# Patient Record
Sex: Female | Born: 1970 | Race: Asian | Hispanic: No | Marital: Married | Smoking: Never smoker
Health system: Southern US, Community
[De-identification: ages and names within clinical notes are randomized; demographics above are authoritative.]

## PROBLEM LIST (undated history)

## (undated) DIAGNOSIS — G629 Polyneuropathy, unspecified: Secondary | ICD-10-CM

## (undated) HISTORY — DX: Polyneuropathy, unspecified: G62.9

## (undated) HISTORY — PX: PARTIAL HYSTERECTOMY: SHX80

## (undated) HISTORY — PX: TONSILLECTOMY: SUR1361

---

## 2008-12-05 HISTORY — PX: LAPAROSCOPIC SUPRACERVICAL HYSTERECTOMY: SUR797

## 2016-01-28 LAB — BASIC METABOLIC PANEL
BUN: 13 mg/dL (ref 4–21)
CREATININE: 0.8 mg/dL (ref <=1.1)
Glucose: 100 mg/dL
Potassium: 4.8 mmol/L (ref 3.4–5.3)
Sodium: 140 mmol/L (ref 137–147)

## 2016-01-28 LAB — LIPID PANEL
Cholesterol: 187 mg/dL (ref 0–200)
HDL: 95 mg/dL — AB (ref 35–70)
LDL Cholesterol: 82 mg/dL
TRIGLYCERIDES: 51 mg/dL (ref 40–160)

## 2016-01-28 LAB — TSH: TSH: 1.44 u[IU]/mL (ref <=5.90)

## 2016-01-28 LAB — CBC AND DIFFERENTIAL: Hemoglobin: 12.7 g/dL (ref 12.0–16.0)

## 2016-04-25 LAB — HM PAP SMEAR: HM PAP: NORMAL

## 2016-04-25 LAB — HM MAMMOGRAPHY: HM Mammogram: NORMAL (ref 0–4)

## 2016-04-25 LAB — HM DIABETES EYE EXAM

## 2017-02-10 ENCOUNTER — Ambulatory Visit: Payer: BLUE CROSS/BLUE SHIELD | Admitting: Family Medicine

## 2017-04-03 ENCOUNTER — Ambulatory Visit: Payer: BLUE CROSS/BLUE SHIELD | Admitting: Family Medicine

## 2017-04-05 ENCOUNTER — Ambulatory Visit: Payer: BLUE CROSS/BLUE SHIELD | Admitting: Adult Health

## 2017-05-02 ENCOUNTER — Ambulatory Visit (INDEPENDENT_AMBULATORY_CARE_PROVIDER_SITE_OTHER): Payer: BLUE CROSS/BLUE SHIELD | Admitting: Family Medicine

## 2017-05-02 ENCOUNTER — Encounter: Payer: Self-pay | Admitting: Family Medicine

## 2017-05-02 VITALS — BP 122/81 | HR 77 | Temp 98.4°F | Ht 69.0 in | Wt 188.0 lb

## 2017-05-02 DIAGNOSIS — E663 Overweight: Secondary | ICD-10-CM | POA: Diagnosis not present

## 2017-05-02 DIAGNOSIS — F1010 Alcohol abuse, uncomplicated: Secondary | ICD-10-CM | POA: Insufficient documentation

## 2017-05-02 DIAGNOSIS — M722 Plantar fascial fibromatosis: Secondary | ICD-10-CM | POA: Diagnosis not present

## 2017-05-02 DIAGNOSIS — R6889 Other general symptoms and signs: Secondary | ICD-10-CM | POA: Insufficient documentation

## 2017-05-02 DIAGNOSIS — Z8249 Family history of ischemic heart disease and other diseases of the circulatory system: Secondary | ICD-10-CM | POA: Diagnosis not present

## 2017-05-02 DIAGNOSIS — R5383 Other fatigue: Secondary | ICD-10-CM | POA: Diagnosis not present

## 2017-05-02 DIAGNOSIS — J3089 Other allergic rhinitis: Secondary | ICD-10-CM | POA: Insufficient documentation

## 2017-05-02 DIAGNOSIS — Z90711 Acquired absence of uterus with remaining cervical stump: Secondary | ICD-10-CM | POA: Insufficient documentation

## 2017-05-02 DIAGNOSIS — Z818 Family history of other mental and behavioral disorders: Secondary | ICD-10-CM | POA: Insufficient documentation

## 2017-05-02 NOTE — Assessment & Plan Note (Addendum)
Pt admits to having some anxiety sx from time to time but states coping mechanisms are well developed  She understands her inc risk due to fam hx

## 2017-05-02 NOTE — Assessment & Plan Note (Signed)
Obtain labs to r/o more common reasons for fatigue ie- anemia, thyroid abn, b12 def etc.

## 2017-05-02 NOTE — Assessment & Plan Note (Signed)
Discussed icing, stretching and wearing supportive shoes at ALL times with pt.     Activity mod may be nec as well  Printouts from sports med advisor to be given to pt

## 2017-05-02 NOTE — Progress Notes (Signed)
New patient office visit note:  Impression and Recommendations:    1. Exercise intolerance and post-exercise fatigue   2. Environmental and seasonal allergies   3. Overweight (BMI 25.0-29.9)   4. Family history of pulmonary embolism- dad and brother   5. Family history of depression including suicide attempt (brother)   6. Plantar fascia syndrome   7. Excessive drinking of alcohol- more than recommended   8. Fatigue, unspecified type      Exercise intolerance/ post exercise fatigue Obtain labs to r/o more common reasons for fatigue ie- anemia, thyroid abn, b12 def etc.     Overweight (BMI 25.0-29.9) Explained to patient what BMI refers to, and what it means medically.    Told patient to think about it as a "medical risk stratification measurement" and how increasing BMI is associated with increasing risk/ or worsening state of various diseases such as hypertension, hyperlipidemia, diabetes, premature OA, depression etc.  American Heart Association guidelines for healthy diet, basically Mediterranean diet, and exercise guidelines of 30 minutes 5 days per week or more discussed in detail.  I rec pt use lose it app to record all intake   Health counseling performed.  All questions answered.   Family history of pulmonary embolism- dad and brother Ask family about any genetic blood dyscrasias.  Hypercoag w/up had to be done on both.  Pt will talk to dad and brother.    Family history of depression including suicide attempt (brother) Pt admits to having some anxiety sx from time to time but states coping mechanisms are well developed  She understands her inc risk due to fam hx  Environmental and seasonal allergies Well controlled currently   Excessive drinking of alcohol- more than recommended 1 -2 max per day is recommended daily allowance and any more than that can be detrimental to health   Plantar fascia syndrome Discussed icing, stretching and wearing supportive  shoes at ALL times with pt.     Activity mod may be nec as well  Printouts from sports med advisor to be given to pt   The patient was counseled, risk factors were discussed, anticipatory guidance given.   Orders Placed This Encounter  Procedures  . CBC with Differential/Platelet  . Comprehensive metabolic panel  . Hemoglobin A1c  . Lipid panel  . VITAMIN D 25 Hydroxy (Vit-D Deficiency, Fractures)  . Vitamin B12  . TSH  . T4, free  . Magnesium  . Phosphorus     Gross side effects, risk and benefits, and alternatives of medications discussed with patient.  Patient is aware that all medications have potential side effects and we are unable to predict every side effect or drug-drug interaction that may occur.  Expresses verbal understanding and consents to current therapy plan and treatment regimen.  Return in about 4 weeks (around 05/30/2017) for FBW and then OV with me to discuss it. .  Please see AVS handed out to patient at the end of our visit for further patient instructions/ counseling done pertaining to today's office visit.    Note: This document was prepared using Dragon voice recognition software and may include unintentional dictation errors.  ----------------------------------------------------------------------------------------------------------------------    Subjective:    Chief complaint:   Chief Complaint  Patient presents with  . Establish Care     HPI: Julie Garcia is a pleasant 46 y.o. female who presents to [facility] today to review their medical history with me and establish care.  I asked the patient to review their chronic problem list with me to ensure everything was updated and accurate.    All recent office visits with other providers, any medical records that patient brought in etc  - I reviewed today.     Also asked pt to get me medical records from [facility] providers/ specialists that they had seen within the  past 3-5 years- if they are in private practice and/or do not work for a [employer], [facility], [facility], [facility] or [employer] owned practice.  Told them to call their specialists to clarify this if they are not sure.    -->  Patient very concerned today with feeling easily exhausted with exercise that has been going on for at least 2 months now.  She been trying to go work out 3 days per week and each time it makes her feel worse.   She has no chest pain, wheezing or difficulty breathing; no muscle pain/ cramping, no dizziness etc. and she used to be a Marketing executive several years ago.  She is concerned that something is wrong.    --->  Also feels it has been difficult to lose weight.  She has been eating a prudent diet and trying to exercise for the past couple of months and she is the biggest she has ever been.   Patient is very frustrated by this.  She has been heavy-at her heaviest-the past 2 years.  -->  Also has bilateral foot pain.  This is mostly in the arch and back of the arch medially.  It hurts her the most when she wakes up first thing in the morning and takes her first steps.  Also has pain after any prolonged sitting or resting and then takes her first steps.  Pain does get better as she moves more.  Pains been going on for a couple months ever since she started going to the gym 3 days per week and trying to do cardio.  She is not wearing supportive shoes at all.  -->  Patient drinks on average 3 IPA beers per night- she likes the homemade and calorically dense beers.  She tends to have 4-5 beers on weekends.    Her husband does the same.   She states today she uses this as a pill to help her deal with the stress in her life.      -->  She denies a history of anxiety or depression but does admit to occasionally feeling anxious about her daughter being taken and being human trafficked;  She gets palpitations in her chest with excess worry at times and this has been chronic.   She has never been  on meds for         Dad- had 2 PE's in past roughly mid to late 50's-->  as well as brother- had PE as well at age 42 or so.   Fam H/o- depression / anxiety.   Brother even admitted himself  At one point- clincally depressed.   No CA at early onset, no CV events erly on  2 yrs ago- had bldwrk in past. Fasting.   PCP prior- [facility]    - McMann[facility] PA-C.   [facility]--- is the most she has ever been lately-- for the past 2 yrs.  Was at 160 for yrs.    Wt Readings from Last 3 Encounters:  05/02/17 188 lb (85.3 kg)   BP Readings from Last 3 Encounters:  05/02/17 122/81  Pulse Readings from Last 3 Encounters:  05/02/17 77   BMI Readings from Last 3 Encounters:  05/02/17 27.76 kg/m    Patient Care Team    Relationship Specialty Notifications Start End  Thomasene Lotpalski, Meilin Brosh, DO PCP - General Family Medicine  04/25/17     Patient Active Problem List   Diagnosis Date Noted  . Overweight (BMI 25.0-29.9) 05/02/2017    Priority: High  . Exercise intolerance/ post exercise fatigue 05/02/2017    Priority: High  . Family history of pulmonary embolism- dad and brother 05/02/2017    Priority: Medium  . Family history of depression including suicide attempt (brother) 05/02/2017    Priority: Medium  . Environmental and seasonal allergies 05/02/2017    Priority: Low  . Plantar fascia syndrome 05/02/2017    Priority: Low  . Excessive drinking of alcohol- more than recommended 05/02/2017    Priority: Low  . S/P laparoscopic supracervical hysterectomy- 2010 for fibroid tumors 05/02/2017     History reviewed. No pertinent past medical history.   History reviewed. No pertinent past medical history.   Past Surgical History:  Procedure Laterality Date  . CESAREAN SECTION    . PARTIAL HYSTERECTOMY    . TONSILLECTOMY       Family History  Problem Relation Age of Onset  . Pulmonary embolism Father   . Hyperlipidemia Father   . Pulmonary embolism Brother   . Depression Brother   .  Pancreatic cancer Paternal Grandmother   . Endometriosis Mother   . Gallbladder disease Mother      History  Drug Use No     History  Alcohol Use  . Yes     History  Smoking Status  . Never Smoker  Smokeless Tobacco  . Never Used     Outpatient Encounter Prescriptions as of 05/02/2017  Medication Sig  . Aspirin (ASPIR-81 PO) Take by mouth daily.  . cetirizine (ZYRTEC) 10 MG tablet Take 10 mg by mouth as needed for allergies.   No facility-administered encounter medications on file as of 05/02/2017.     Allergies: Patient has no known allergies.   Review of Systems  Constitutional: Negative for chills, diaphoresis, fever, malaise/fatigue and weight loss.  HENT: Negative for congestion, sore throat and tinnitus.   Eyes: Negative for blurred vision, double vision and photophobia.  Respiratory: Negative for cough and wheezing.   Cardiovascular: Positive for palpitations. Negative for chest pain.       Chronic no concern for pt   Gastrointestinal: Negative for blood in stool, diarrhea, nausea and vomiting.  Genitourinary: Negative for dysuria, frequency and urgency.  Musculoskeletal: Negative for joint pain and myalgias.  Skin: Negative for itching and rash.  Neurological: Negative for dizziness, focal weakness, weakness and headaches.  Endo/Heme/Allergies: Negative for environmental allergies and polydipsia. Does not bruise/bleed easily.  Psychiatric/Behavioral: Negative for depression and memory loss. The patient is not nervous/anxious and does not have insomnia.      Objective:   Blood pressure 122/81, pulse 77, temperature 98.4 F (36.9 C), temperature source Oral, height 5\' 9"  (1.753 m), weight 188 lb (85.3 kg), SpO2 100 %. Body mass index is 27.76 kg/m. General: Well Developed, well nourished, and in no acute distress.  Neuro: Alert and oriented x3, extra-ocular muscles intact, sensation grossly intact.  HEENT:[place]/AT, PERRLA, neck supple, No carotid  bruits Skin: no gross rashes  Cardiac: Regular rate and rhythm Respiratory: Essentially clear to auscultation bilaterally. Not using accessory muscles, speaking in full sentences.  Abdominal: not grossly distended  Musculoskeletal: Ambulates w/o diff, FROM * 4 ext.  Vasc: less 2 sec cap RF, warm and pink  Psych:  No HI/SI, judgement and insight good, Euthymic mood. Full Affect.    No results found for this or any previous visit (from the past 2160 hour(s)).

## 2017-05-02 NOTE — Assessment & Plan Note (Signed)
Well-controlled currently 

## 2017-05-02 NOTE — Assessment & Plan Note (Addendum)
Explained to patient what BMI refers to, and what it means medically.    Told patient to think about it as a "medical risk stratification measurement" and how increasing BMI is associated with increasing risk/ or worsening state of various diseases such as hypertension, hyperlipidemia, diabetes, premature OA, depression etc.  American Heart Association guidelines for healthy diet, basically Mediterranean diet, and exercise guidelines of 30 minutes 5 days per week or more discussed in detail.  I rec pt use lose it app to record all intake   Health counseling performed.  All questions answered.

## 2017-05-02 NOTE — Assessment & Plan Note (Signed)
1 -2 max per day is recommended daily allowance and any more than that can be detrimental to health

## 2017-05-02 NOTE — Patient Instructions (Signed)
Exercise guidelines is 150 minutes of moderate intensity exercise weekly that equates to 30 minutes 5 days per week.   Consider obtaining a life coach  Follow-up in the near future for blood work-fasting and then follow-up office visit with me to discuss results   Guidelines for Losing Weight   We want weight loss that will last so you should lose 1-2 pounds a week.  THAT IS IT! Please pick THREE things a month to change. Once it is a habit check off the item. Then pick another three items off the list to become habits.  If you are already doing a habit on the list GREAT!  Cross that item off!  Don't drink your calories. Ie, alcohol, soda, fruit juice, and sweet tea.   Drink more water. Drink a glass when you feel hungry or before each meal.   Eat breakfast - Complex carb and protein (likeDannon light and fit yogurt, oatmeal, fruit, eggs, [place] bacon).  Measure your cereal.  Eat no more than one cup a day. (ie Kashi)  Eat an apple a day.  Add a vegetable a day.  Try a new vegetable a month.  Use Pam! Stop using oil or butter to cook.  Don't finish your plate or use smaller plates.  Share your dessert.  Eat sugar free Jello for dessert or frozen grapes.  Don't eat 2-3 hours before bed.  Switch to whole wheat bread, pasta, and brown rice.  Make healthier choices when you eat out. No fries!  Pick baked chicken, NOT fried.  Don't forget to SLOW DOWN when you eat. It is not going anywhere.   Take the stairs.  Park far away in the parking lot  Lift soup cans (or weights) for 10 minutes while watching TV.  Walk at work for 10 minutes during break.  Walk outside 1 time a week with your friend, kids, dog, or significant other.  Start a walking group at church.  Walk the mall as much as you can tolerate.   Keep a food diary.  Weigh yourself daily.  Walk for 15 minutes 3 days per week.  Cook at home more often and eat out less. If life happens and you go back  to old habits, it is okay.  Just start over. You can do it!  If you experience chest pain, get short of breath, or tired during the exercise, please stop immediately and inform your doctor.    Before you even begin to attack a weight-loss plan, it pays to remember this: You are not fat. You have fat. Losing weight isn't about blame or shame; it's simply another achievement to accomplish. Dieting is like any other skill-you have to buckle down and work at it. As long as you act in a smart, reasonable way, you'll ultimately get where you want to be. Here are some weight loss pearls for you.   1. It's Not a Diet. It's a Lifestyle Thinking of a diet as something you're on and suffering through only for the short term doesn't work. To shed weight and keep it off, you need to make permanent changes to the way you eat. It's OK to indulge occasionally, of course, but if you cut calories temporarily and then revert to your old way of eating, you'll gain back the weight quicker than you can say yo-yo. Use it to lose it. Research shows that one of the best predictors of long-term weight loss is how many pounds you drop in the first  month. For that reason, nutritionists often suggest being stricter for the first two weeks of your new eating strategy to build momentum. Cut out added sugar and alcohol and avoid unrefined carbs. After that, figure out how you can reincorporate them in a way that's healthy and maintainable.  2. There's a Right Way to Exercise Working out burns calories and fat and boosts your metabolism by building muscle. But those trying to lose weight are notorious for overestimating the number of calories they burn and underestimating the amount they take in. Unfortunately, your system is biologically programmed to hold on to extra pounds and that means when you start exercising, your body senses the deficit and ramps up its hunger signals. If you're not diligent, you'll eat everything you burn and  then some. Use it, to lose it. Cardio gets all the exercise glory, but strength and interval training are the real heroes. They help you build lean muscle, which in turn increases your metabolism and calorie-burning ability 3. Don't Overreact to Mild Hunger Some people have a hard time losing weight because of hunger anxiety. To them, being hungry is bad-something to be avoided at all costs-so they carry snacks with them and eat when they don't need to. Others eat because they're stressed out or bored. While you never want to get to the point of being ravenous (that's when bingeing is likely to happen), a hunger pang, a craving, or the fact that it's 3:00 p.m. should not send you racing for the vending machine or obsessing about the energy bar in your purse. Ideally, you should put off eating until your stomach is growling and it's difficult to concentrate.  Use it to lose it. When you feel the urge to eat, use the HALT method. Ask yourself, Am I really hungry? Or am I angry or anxious, lonely or bored, or tired? If you're still not certain, try the apple test. If you're truly hungry, an apple should seem delicious; if it doesn't, something else is going on. Or you can try drinking water and making yourself busy, if you are still hungry try a healthy snack.  4. Not All Calories Are Created Equal The mechanics of weight loss are pretty simple: Take in fewer calories than you use for energy. But the kind of food you eat makes all the difference. Processed food that's high in saturated fat and refined starch or sugar can cause inflammation that disrupts the hormone signals that tell your brain you're full. The result: You eat a lot more.  Use it to lose it. Clean up your diet. Swap in whole, unprocessed foods, including vegetables, lean protein, and healthy fats that will fill you up and give you the biggest nutritional bang for your calorie buck. In a few weeks, as your brain starts receiving regular hunger and  fullness signals once again, you'll notice that you feel less hungry overall and naturally start cutting back on the amount you eat.  5. Protein, Produce, and Plant-Based Fats Are Your Weight-Loss Trinity Here's why eating the three Ps regularly will help you drop pounds. Protein fills you up. You need it to build lean muscle, which keeps your metabolism humming so that you can torch more fat. People in a weight-loss program who ate double the recommended daily allowance for protein (about 110 grams for a 150-pound woman) lost 70 percent of their weight from fat, while people who ate the RDA lost only about 40 percent, one study found. Produce is packed with filling  fiber. "It's very difficult to consume too many calories if you're eating a lot of vegetables. Example: Three cups of broccoli is a lot of food, yet only 93 calories. (Fruit is another story. It can be easy to overeat and can contain a lot of calories from sugar, so be sure to monitor your intake.) Plant-based fats like olive oil and those in avocados and nuts are healthy and extra satiating.  Use it to lose it. Aim to incorporate each of the three Ps into every meal and snack. People who eat protein throughout the day are able to keep weight off, according to a study in the American Journal of Clinical Nutrition. In addition to meat, poultry and seafood, good sources are beans, lentils, eggs, tofu, and yogurt. As for fat, keep portion sizes in check by measuring out salad dressing, oil, and nut butters (shoot for one to two tablespoons). Finally, eat veggies or a little fruit at every meal. People who did that consumed 308 fewer calories but didn't feel any hungrier than when they didn't eat more produce.  7. How You Eat Is As Important As What You Eat In order for your brain to register that you're full, you need to focus on what you're eating. Sit down whenever you eat, preferably at a table. Turn off the TV or computer, put down your phone,  and look at your food. Smell it. Chew slowly, and don't put another bite on your fork until you swallow. When women ate lunch this attentively, they consumed 30 percent less when snacking later than those who listened to an audiobook at lunchtime, according to a study in the [place] Journal of Nutrition. 8. Weighing Yourself Really Works The scale provides the best evidence about whether your efforts are paying off. Seeing the numbers tick up or down or stagnate is motivation to keep going-or to rethink your approach. A 2015 study at [facility] found that daily weigh-ins helped people lose more weight, keep it off, and maintain that loss, even after two years. Use it to lose it. Step on the scale at the same time every day for the best results. If your weight shoots up several pounds from one weigh-in to the next, don't freak out. Eating a lot of salt the night before or having your period is the likely culprit. The number should return to normal in a day or two. It's a steady climb that you need to do something about. 9. Too Much Stress and Too Little Sleep Are Your Enemies When you're tired and frazzled, your body cranks up the production of cortisol, the stress hormone that can cause carb cravings. Not getting enough sleep also boosts your levels of ghrelin, a hormone associated with hunger, while suppressing leptin, a hormone that signals fullness and satiety. People on a diet who slept only five and a half hours a night for two weeks lost 55 percent less fat and were hungrier than those who slept eight and a half hours, according to a study in the [place] [employer] Journal. Use it to lose it. Prioritize sleep, aiming for seven hours or more a night, which research shows helps lower stress. And make sure you're getting quality zzz's. If a snoring spouse or a fidgety cat wakes you up frequently throughout the night, you may end up getting the equivalent of just four hours of sleep,  according to a study from [facility]. Keep pets out of the bedroom, and use a white-noise app to  drown out snoring. 10. You Will Hit a plateau-And You Can Bust Through It As you slim down, your body releases much less leptin, the fullness hormone.  If you're not strength training, start right now. Building muscle can raise your metabolism to help you overcome a plateau. To keep your body challenged and burning calories, incorporate new moves and more intense intervals into your workouts or add another sweat session to your weekly routine. Alternatively, cut an extra 100 calories or so a day from your diet. Now that you've lost weight, your body simply doesn't need as much fuel.      Since food equals calories, in order to lose weight you must either eat fewer calories, exercise more to burn off calories with activity, or both. Food that is not used to fuel the body is stored as fat. A major component of losing weight is to make smarter food choices. Here's how:  1)   Limit non-nutritious foods, such as: Sugar, honey, syrups and candy Pastries, donuts, pies, cakes and cookies Soft drinks, sweetened juices and alcoholic beverages  2)  Cut down on high-fat foods by: - Choosing poultry, fish or lean red meat - Choosing low-fat cooking methods, such as baking, broiling, steaming, grilling and boiling - Using low-fat or non-fat dairy products - Using vinaigrette, herbs, lemon or fat-free salad dressings - Avoiding fatty meats, such as bacon, sausage, franks, ribs and luncheon meats - Avoiding high-fat snacks like nuts, chips and chocolate - Avoiding fried foods - Using less butter, margarine, oil and mayonnaise - Avoiding high-fat gravies, cream sauces and cream-based soups  3) Eat a variety of foods, including: - Fruit and vegetables that are raw, steamed or baked - Whole grains, breads, cereal, rice and pasta - Dairy products, such as low-fat or non-fat milk or yogurt, low-fat  cottage cheese and low-fat cheese - Protein-rich foods like chicken, [place], fish, lean meat and legumes, or beans  4) Change your eating habits by: - Eat three balanced meals a day to help control your hunger - Watch portion sizes and eat small servings of a variety of foods - Choose low-calorie snacks - Eat only when you are hungry and stop when you are satisfied - Eat slowly and try not to perform other tasks while eating - Find other activities to distract you from food, such as walking, taking up a hobby or being involved in the community - Include regular exercise in your daily routine ( minimum of 20 min of moderate-intensity exercise at least 5 days/week)  - Find a support group, if necessary, for emotional support in your weight loss journey         Easy ways to cut 100 calories   1. Eat your eggs with hot sauce OR salsa instead of cheese.  Eggs are great for breakfast, but many people consider eggs and cheese to be BFFs. Instead of cheese-1 oz. of cheddar has 114 calories-top your eggs with hot sauce, which contains no calories and helps with satiety and metabolism. Salsa is also a great option!!  2. Top your toast, waffles or pancakes with fresh berries instead of jelly or syrup. Half a cup of berries-fresh, frozen or thawed-has about 40 calories, compared with 2 tbsp. of maple syrup or jelly, which both have about 100 calories. The berries will also give you a good punch of fiber, which helps keep you full and satisfied and won't spike blood sugar quickly like the jelly or syrup. 3. Swap the non-fat latte  for black coffee with a splash of half-and-half. Contrary to its name, that non-fat latte has 130 calories and a startling 19g of carbohydrates per 16 oz. serving. Replacing that 'light' drinkable dessert with a black coffee with a splash of half-and-half saves you more than 100 calories per 16 oz. serving. 4. Sprinkle salads with freeze-dried raspberries instead of dried  cranberries. If you want a sweet addition to your nutritious salad, stay away from dried cranberries. They have a whopping 130 calories per  cup and 30g carbohydrates. Instead, sprinkle freeze-dried raspberries guilt-free and save more than 100 calories per  cup serving, adding 3g of belly-filling fiber. 5. Go for mustard in place of mayo on your sandwich. Mustard can add really nice flavor to any sandwich, and there are tons of varieties, from spicy to honey. A serving of mayo is 95 calories, versus 10 calories in a serving of mustard.  Or try an avocado mayo spread: You can find the recipe few click this link: https://www.californiaavocado.com/recipes/recipe-container/[place]ifornia-avocado-mayo 6. Choose a DIY salad dressing instead of the store-bought kind. Mix Dijon or whole grain mustard with low-fat Kefir or red wine vinegar and garlic. 7. Use hummus as a spread instead of a dip. Use hummus as a spread on a high-fiber cracker or tortilla with a sandwich and save on calories without sacrificing taste. 8. Pick just one salad "accessory." Salad isn't automatically a calorie winner. It's easy to over-accessorize with toppings. Instead of topping your salad with nuts, avocado and cranberries (all three will clock in at 313 calories), just pick one. The next day, choose a different accessory, which will also keep your salad interesting. You don't wear all your jewelry every day, right? 9. Ditch the white pasta in favor of spaghetti squash. One cup of cooked spaghetti squash has about 40 calories, compared with traditional spaghetti, which comes with more than 200. Spaghetti squash is also nutrient-dense. It's a good source of fiber and Vitamins A and C, and it can be eaten just like you would eat pasta-with a great tomato sauce and [place] meatballs or with pesto, tofu and spinach, for example. 10. Dress up your chili, soups and stews with non-fat [place] yogurt instead of sour cream. Just a 'dollop' of  sour cream can set you back 115 calories and a whopping 12g of fat-seven of which are of the artery-clogging variety. Added bonus: [place] yogurt is packed with muscle-building protein, calcium and B Vitamins. 11. Mash cauliflower instead of mashed potatoes. One cup of traditional mashed potatoes-in all their creamy goodness-has more than 200 calories, compared to mashed cauliflower, which you can typically eat for less than 100 calories per 1 cup serving. Cauliflower is a great source of the antioxidant indole-3-carbinol (I3C), which may help reduce the risk of some cancers, like breast cancer. 12. Ditch the ice cream sundae in favor of a [place] yogurt parfait. Instead of a cup of ice cream or fro-yo for dessert, try 1 cup of nonfat Greek yogurt topped with fresh berries and a sprinkle of cacao nibs. Both toppings are packed with antioxidants, which can help reduce cellular inflammation and oxidative damage. And the comparison is a no-brainer: One cup of ice cream has about 275 calories; one cup of frozen yogurt has about 230; and a cup of Greek yogurt has just 130, plus twice the protein, so you're less likely to return to the freezer for a second helping. 13. Put olive oil in a spray container instead of using it directly from the bottle.  Each tablespoon of olive oil is 120 calories and 15g of fat. Use a mister instead of pouring it straight into the pan or onto a salad. This allows for portion control and will save you more than 100 calories. 14. When baking, substitute canned pumpkin for butter or oil. Canned pumpkin-not pumpkin pie mix-is loaded with Vitamin A, which is important for skin and eye health, as well as immunity. And the comparisons are pretty crazy:  cup of canned pumpkin has about 40 calories, compared to butter or oil, which has more than 800 calories. Yes, 800 calories. Applesauce and mashed banana can also serve as good substitutions for butter or oil, usually in a 1:1 ratio. 15. Top  casseroles with high-fiber cereal instead of breadcrumbs. Breadcrumbs are typically made with white bread, while breakfast cereals contain 5-9g of fiber per serving. Not only will you save more than 150 calories per  cup serving, the swap will also keep you more full and you'll get a metabolism boost from the added fiber. 16. Snack on pistachios instead of macadamia nuts. Believe it or not, you get the same amount of calories from 35 pistachios (100 calories) as you would from only five macadamia nuts. 17. Chow down on kale chips rather than potato chips. This is my favorite 'don't knock it 'till you try it' swap. Kale chips are so easy to make at home, and you can spice them up with a little grated parmesan or chili powder. Plus, they're a mere fraction of the calories of potato chips, but with the same crunch factor we crave so often. 18. Add seltzer and some fruit slices to your cocktail instead of soda or fruit juice. One cup of soda or fruit juice can pack on as much as 140 calories. Instead, use seltzer and fruit slices. The fruit provides valuable phytochemicals, such as flavonoids and anthocyanins, which help to combat cancer and stave off the aging process.

## 2017-05-02 NOTE — Assessment & Plan Note (Signed)
Ask family about any genetic blood dyscrasias.  Hypercoag w/up had to be done on both.  Pt will talk to dad and brother.

## 2017-05-10 ENCOUNTER — Other Ambulatory Visit (INDEPENDENT_AMBULATORY_CARE_PROVIDER_SITE_OTHER): Payer: BLUE CROSS/BLUE SHIELD

## 2017-05-10 DIAGNOSIS — E663 Overweight: Secondary | ICD-10-CM | POA: Diagnosis not present

## 2017-05-10 DIAGNOSIS — F1010 Alcohol abuse, uncomplicated: Secondary | ICD-10-CM | POA: Diagnosis not present

## 2017-05-10 DIAGNOSIS — R6889 Other general symptoms and signs: Secondary | ICD-10-CM

## 2017-05-10 DIAGNOSIS — R5383 Other fatigue: Secondary | ICD-10-CM

## 2017-05-11 LAB — CBC WITH DIFFERENTIAL/PLATELET
BASOS: 0 %
Basophils Absolute: 0 10*3/uL (ref 0.0–0.2)
EOS (ABSOLUTE): 0.1 10*3/uL (ref 0.0–0.4)
EOS: 2 %
HEMATOCRIT: 40.6 % (ref 34.0–46.6)
HEMOGLOBIN: 13.7 g/dL (ref 11.1–15.9)
IMMATURE GRANS (ABS): 0 10*3/uL (ref 0.0–0.1)
IMMATURE GRANULOCYTES: 0 %
LYMPHS: 33 %
Lymphocytes Absolute: 2.2 10*3/uL (ref 0.7–3.1)
MCH: 32.5 pg (ref 26.6–33.0)
MCHC: 33.7 g/dL (ref 31.5–35.7)
MCV: 96 fL (ref 79–97)
MONOCYTES: 6 %
Monocytes Absolute: 0.4 10*3/uL (ref 0.1–0.9)
NEUTROS ABS: 3.9 10*3/uL (ref 1.4–7.0)
NEUTROS PCT: 59 %
PLATELETS: 243 10*3/uL (ref 150–379)
RBC: 4.21 x10E6/uL (ref 3.77–5.28)
RDW: 14.3 % (ref 12.3–15.4)
WBC: 6.7 10*3/uL (ref 3.4–10.8)

## 2017-05-11 LAB — COMPREHENSIVE METABOLIC PANEL
A/G RATIO: 1.5 (ref 1.2–2.2)
ALBUMIN: 4.4 g/dL (ref 3.5–5.5)
ALT: 28 IU/L (ref 0–32)
AST: 17 IU/L (ref 0–40)
Alkaline Phosphatase: 65 IU/L (ref 39–117)
BUN / CREAT RATIO: 13 (ref 9–23)
BUN: 11 mg/dL (ref 6–24)
Bilirubin Total: 0.3 mg/dL (ref 0.0–1.2)
CALCIUM: 9.2 mg/dL (ref 8.7–10.2)
CO2: 25 mmol/L (ref 18–29)
Chloride: 99 mmol/L (ref 96–106)
Creatinine, Ser: 0.85 mg/dL (ref 0.57–1.00)
GFR, EST AFRICAN AMERICAN: 96 mL/min/{1.73_m2} (ref >59)
GFR, EST NON AFRICAN AMERICAN: 84 mL/min/{1.73_m2} (ref >59)
Globulin, Total: 2.9 g/dL (ref 1.5–4.5)
Glucose: 92 mg/dL (ref 65–99)
POTASSIUM: 4.6 mmol/L (ref 3.5–5.2)
Sodium: 141 mmol/L (ref 134–144)
TOTAL PROTEIN: 7.3 g/dL (ref 6.0–8.5)

## 2017-05-11 LAB — LIPID PANEL
CHOL/HDL RATIO: 2.2 ratio (ref 0.0–4.4)
Cholesterol, Total: 193 mg/dL (ref 100–199)
HDL: 88 mg/dL (ref >39)
LDL Calculated: 95 mg/dL (ref 0–99)
Triglycerides: 52 mg/dL (ref 0–149)
VLDL Cholesterol Cal: 10 mg/dL (ref 5–40)

## 2017-05-11 LAB — PHOSPHORUS: Phosphorus: 3.4 mg/dL (ref 2.5–4.5)

## 2017-05-11 LAB — HEMOGLOBIN A1C
Est. average glucose Bld gHb Est-mCnc: 117 mg/dL
Hgb A1c MFr Bld: 5.7 % — ABNORMAL HIGH (ref 4.8–5.6)

## 2017-05-11 LAB — VITAMIN D 25 HYDROXY (VIT D DEFICIENCY, FRACTURES): VIT D 25 HYDROXY: 32.2 ng/mL (ref 30.0–100.0)

## 2017-05-11 LAB — TSH: TSH: 2.27 u[IU]/mL (ref 0.450–4.500)

## 2017-05-11 LAB — VITAMIN B12: Vitamin B-12: 429 pg/mL (ref 232–1245)

## 2017-05-11 LAB — MAGNESIUM: Magnesium: 2.4 mg/dL — ABNORMAL HIGH (ref 1.6–2.3)

## 2017-05-11 LAB — T4, FREE: FREE T4: 1.11 ng/dL (ref 0.82–1.77)

## 2017-05-18 ENCOUNTER — Ambulatory Visit (INDEPENDENT_AMBULATORY_CARE_PROVIDER_SITE_OTHER): Payer: BLUE CROSS/BLUE SHIELD | Admitting: Family Medicine

## 2017-05-18 ENCOUNTER — Encounter: Payer: Self-pay | Admitting: Family Medicine

## 2017-05-18 DIAGNOSIS — R7302 Impaired glucose tolerance (oral): Secondary | ICD-10-CM

## 2017-05-18 NOTE — Patient Instructions (Addendum)
Take a MVI daily, B- complex and a 2,000IU vit D3 OTC per day as well.      Risk factors for prediabetes and type 2 diabetes  Researchers don't fully understand why some people develop prediabetes and type 2 diabetes and others don't.  It's clear that certain factors increase the risk, however, including:  Weight. The more fatty tissue you have, the more resistant your cells become to insulin.  Inactivity. The less active you are, the greater your risk. Physical activity helps you control your weight, uses up glucose as energy and makes your cells more sensitive to insulin.  Family history. Your risk increases if a parent or sibling has type 2 diabetes.  Race. Although it's unclear why, people of certain races - including blacks, Hispanics, American Indians and Asian-Americans - are at higher risk.  Age. Your risk increases as you get older. This may be because you tend to exercise less, lose muscle mass and gain weight as you age. But type 2 diabetes is also increasing dramatically among children, adolescents and younger adults.  Gestational diabetes. If you developed gestational diabetes when you were pregnant, your risk of developing prediabetes and type 2 diabetes later increases. If you gave birth to a baby weighing more than 9 pounds (4 kilograms), you're also at risk of type 2 diabetes.  Polycystic ovary syndrome. For women, having polycystic ovary syndrome - a common condition characterized by irregular menstrual periods, excess hair growth and obesity - increases the risk of diabetes.  High blood pressure. Having blood pressure over 140/90 millimeters of mercury (mm Hg) is linked to an increased risk of type 2 diabetes.  Abnormal cholesterol and triglyceride levels. If you have low levels of high-density lipoprotein (HDL), or "good," cholesterol, your risk of type 2 diabetes is higher. Triglycerides are another type of fat carried in the blood. People with high levels of triglycerides have  an increased risk of type 2 diabetes. Your doctor can let you know what your cholesterol and triglyceride levels are.  A good guide to good carbs: The glycemic index ---If you have diabetes, or at risk for diabetes, you know all too well that when you eat carbohydrates, your blood sugar goes up. The total amount of carbs you consume at a meal or in a snack mostly determines what your blood sugar will do. But the food itself also plays a role. A serving of white rice has almost the same effect as eating pure table sugar - a quick, high spike in blood sugar. A serving of lentils has a slower, smaller effect.  ---Picking good sources of carbs can help you control your blood sugar and your weight. Even if you don't have diabetes, eating healthier carbohydrate-rich foods can help ward off a host of chronic conditions, from heart disease to various cancers to, well, diabetes.  ---One way to choose foods is with the glycemic index (GI). This tool measures how much a food boosts blood sugar.  The glycemic index rates the effect of a specific amount of a food on blood sugar compared with the same amount of pure glucose. A food with a glycemic index of 28 boosts blood sugar only 28% as much as pure glucose. One with a GI of 95 acts like pure glucose.    High glycemic foods result in a quick spike in insulin and blood sugar (also known as blood glucose).  Low glycemic foods have a slower, smaller effect- these are healthier for you.   Using the  glycemic index Using the glycemic index is easy: choose foods in the low GI category instead of those in the high GI category (see below), and go easy on those in between. Low glycemic index (GI of 55 or less): Most fruits and vegetables, beans, minimally processed grains, pasta, low-fat dairy foods, and nuts.  Moderate glycemic index (GI 56 to 69): White and sweet potatoes, corn, white rice, couscous, breakfast cereals such as Cream of Wheat and Mini Wheats.  High  glycemic index (GI of 70 or higher): White bread, rice cakes, most crackers, bagels, cakes, doughnuts, croissants, most packaged breakfast cereals. You can see the values for 100 commons foods and get links to more at [email or web address].RecordDebt.hu.  Swaps for lowering glycemic index  Instead of this high-glycemic index food Eat this lower-glycemic index food  White rice Brown rice or converted rice  Instant oatmeal Steel-cut oats  Cornflakes Bran flakes  Baked potato Pasta, bulgur  White bread Whole-grain bread  Corn Peas or leafy greens       Prediabetes Eating Plan  Prediabetes--also called impaired glucose tolerance or impaired fasting glucose--is a condition that causes blood sugar (blood glucose) levels to be higher than normal. Following a healthy diet can help to keep prediabetes under control. It can also help to lower the risk of type 2 diabetes and heart disease, which are increased in people who have prediabetes. Along with regular exercise, a healthy diet:  Promotes weight loss.  Helps to control blood sugar levels.  Helps to improve the way that the body uses insulin.   WHAT DO I NEED TO KNOW ABOUT THIS EATING PLAN?   Use the glycemic index (GI) to plan your meals. The index tells you how quickly a food will raise your blood sugar. Choose low-GI foods. These foods take a longer time to raise blood sugar.  Pay close attention to the amount of carbohydrates in the food that you eat. Carbohydrates increase blood sugar levels.  Keep track of how many calories you take in. Eating the right amount of calories will help you to achieve a healthy weight. Losing about 7 percent of your starting weight can help to prevent type 2 diabetes.  You may want to follow a Mediterranean diet. This diet includes a lot of vegetables, lean meats or fish, whole grains, fruits, and healthy oils and fats.   WHAT FOODS CAN I EAT?  Grains Whole grains, such as whole-wheat or whole-grain  breads, crackers, cereals, and pasta. Unsweetened oatmeal. Bulgur. Barley. Quinoa. Brown rice. Corn or whole-wheat flour tortillas or taco shells. Vegetables Lettuce. Spinach. Peas. Beets. Cauliflower. Cabbage. Broccoli. Carrots. Tomatoes. Squash. Eggplant. Herbs. Peppers. Onions. Cucumbers. Brussels sprouts. Fruits Berries. Bananas. Apples. Oranges. Grapes. Papaya. Mango. Pomegranate. Kiwi. Grapefruit. Cherries. Meats and Other Protein Sources Seafood. Lean meats, such as chicken and [place] or lean cuts of pork and beef. Tofu. Eggs. Nuts. Beans. Dairy Low-fat or fat-free dairy products, such as yogurt, cottage cheese, and cheese. Beverages Water. Tea. Coffee. Sugar-free or diet soda. Seltzer water. Milk. Milk alternatives, such as soy or almond milk. Condiments Mustard. Relish. Low-fat, low-sugar ketchup. Low-fat, low-sugar barbecue sauce. Low-fat or fat-free mayonnaise. Sweets and Desserts Sugar-free or low-fat pudding. Sugar-free or low-fat ice cream and other frozen treats. Fats and Oils Avocado. Walnuts. Olive oil. The items listed above may not be a complete list of recommended foods or beverages. Contact your dietitian for more options.    WHAT FOODS ARE NOT RECOMMENDED?  Grains Refined white flour and flour products,  such as bread, pasta, snack foods, and cereals. Beverages Sweetened drinks, such as sweet iced tea and soda. Sweets and Desserts Baked goods, such as cake, cupcakes, pastries, cookies, and cheesecake. The items listed above may not be a complete list of foods and beverages to avoid. Contact your dietitian for more information.   This information is not intended to replace advice given to you by your health care provider. Make sure you discuss any questions you have with your health care provider.   Document Released: 04/07/2015 Document Reviewed: 04/07/2015 Elsevier Interactive Patient Education [employer]2016 Elsevier Inc.

## 2017-05-18 NOTE — Progress Notes (Signed)
Assessment and plan:  1. Glucose intolerance (impaired glucose tolerance)     No problem-specific Assessment & Plan notes found for this encounter.     No orders of the defined types were placed in this encounter.    Discontinued Medications   No medications on file      No orders of the defined types were placed in this encounter.    No Follow-up on file.  Anticipatory guidance and routine counseling done re: condition, txmnt options and need for follow up. All questions of patient's were answered.   Gross side effects, risk and benefits, and alternatives of medications discussed with patient.  Patient is aware that all medications have potential side effects and we are unable to predict every sideeffect or drug-drug interaction that may occur.  Expresses verbal understanding and consents to current therapy plan and treatment regiment.  Please see AVS handed out to patient at the end of our visit for additional patient instructions/ counseling done pertaining to today's office visit.  Note: This document was prepared using Dragon voice recognition software and may include unintentional dictation errors.   ----------------------------------------------------------------------------------------------------------------------  Subjective:   CC:   Julie Garcia is a 46 y.o. female who presents to [facility] today for review and discussion of recent bloodwork that was done.  1. All recent blood work that we ordered was reviewed with patient today.   Patient was counseled on all abnormalities and we discussed dietary and lifestyle changes that could help those values (also medications when appropriate).   Extensive health counseling performed and all patient's concerns/ questions were addressed.       Wt Readings from Last 3 Encounters:  05/18/17 186 lb 6.4 oz (84.6 kg)  05/02/17  188 lb (85.3 kg)   BP Readings from Last 3 Encounters:  05/18/17 113/76  05/02/17 122/81   Pulse Readings from Last 3 Encounters:  05/18/17 (!) 101  05/02/17 77   BMI Readings from Last 3 Encounters:  05/18/17 27.53 kg/m  05/02/17 27.76 kg/m     Patient Care Team    Relationship Specialty Notifications Start End  Thomasene Lot, DO PCP - General Family Medicine  04/25/17     Full medical history updated and reviewed in the office today  Patient Active Problem List   Diagnosis Date Noted  . Overweight (BMI 25.0-29.9) 05/02/2017    Priority: High  . Exercise intolerance/ post exercise fatigue 05/02/2017    Priority: High  . Family history of pulmonary embolism- dad and brother 05/02/2017    Priority: Medium  . Family history of depression including suicide attempt (brother) 05/02/2017    Priority: Medium  . Environmental and seasonal allergies 05/02/2017    Priority: Low  . Plantar fascia syndrome 05/02/2017    Priority: Low  . Excessive drinking of alcohol- more than recommended 05/02/2017    Priority: Low  . Glucose intolerance (impaired glucose tolerance) 05/18/2017  . S/P laparoscopic supracervical hysterectomy- 2010 for fibroid tumors 05/02/2017    History reviewed. No pertinent past medical history.  Past Surgical History:  Procedure Laterality Date  . CESAREAN SECTION    . PARTIAL HYSTERECTOMY    . TONSILLECTOMY      Social History  Substance Use Topics  . Smoking status: Never Smoker  . Smokeless tobacco: Never Used  . Alcohol use Yes    Family Hx: Family History  Problem Relation Age of Onset  . Pulmonary embolism Father   .  Hyperlipidemia Father   . Pulmonary embolism Brother   . Depression Brother   . Pancreatic cancer Paternal Grandmother   . Endometriosis Mother   . Gallbladder disease Mother      Medications: Current Outpatient Prescriptions  Medication Sig Dispense Refill  . Aspirin (ASPIR-81 PO) Take by mouth daily.    .  cetirizine (ZYRTEC) 10 MG tablet Take 10 mg by mouth as needed for allergies.     No current facility-administered medications for this visit.     Allergies:  No Known Allergies   Review of Systems: General:   No F/C, wt loss Pulm:   No DIB, SOB, pleuritic chest pain Card:  No CP, palpitations Abd:  No n/v/d or pain Ext:  No inc edema from baseline  Objective:  Blood pressure 113/76, pulse (!) 101, height 5\' 9"  (1.753 m), weight 186 lb 6.4 oz (84.6 kg). Body mass index is 27.53 kg/m. Gen:   Well NAD, A and O *3 HEENT:    [place]/AT, EOMI,  MMM Lungs:   Normal work of breathing. CTA B/L, no Wh, rhonchi Heart:   RRR, S1, S2 WNL's, no MRG Abd:   No gross distention Exts:    warm, pink,  Brisk capillary refill, warm and well perfused.  Psych:    No HI/SI, judgement and insight good, Euthymic mood. Full Affect.   Recent Results (from the past 2160 hour(s))  CBC with Differential/Platelet     Status: None   Collection Time: 05/10/17  8:52 AM  Result Value Ref Range   WBC 6.7 3.4 - 10.8 x10E3/uL   RBC 4.21 3.77 - 5.28 x10E6/uL   Hemoglobin 13.7 11.1 - 15.9 g/dL   Hematocrit 62.140.6 30.834.0 - 46.6 %   MCV 96 79 - 97 fL   MCH 32.5 26.6 - 33.0 pg   MCHC 33.7 31.5 - 35.7 g/dL   RDW 65.714.3 84.612.3 - 96.215.4 %   Platelets 243 150 - 379 x10E3/uL   Neutrophils 59 Not Estab. %   Lymphs 33 Not Estab. %   Monocytes 6 Not Estab. %   Eos 2 Not Estab. %   Basos 0 Not Estab. %   Neutrophils Absolute 3.9 1.4 - 7.0 x10E3/uL   Lymphocytes Absolute 2.2 0.7 - 3.1 x10E3/uL   Monocytes Absolute 0.4 0.1 - 0.9 x10E3/uL   EOS (ABSOLUTE) 0.1 0.0 - 0.4 x10E3/uL   Basophils Absolute 0.0 0.0 - 0.2 x10E3/uL   Immature Granulocytes 0 Not Estab. %   Immature Grans (Abs) 0.0 0.0 - 0.1 x10E3/uL  Comprehensive metabolic panel     Status: None   Collection Time: 05/10/17  8:52 AM  Result Value Ref Range   Glucose 92 65 - 99 mg/dL   BUN 11 6 - 24 mg/dL   Creatinine, Ser 9.520.85 0.57 - 1.00 mg/dL   GFR calc non Af Amer 84  >59 mL/min/1.73   GFR calc Af Amer 96 >59 mL/min/1.73   BUN/Creatinine Ratio 13 9 - 23   Sodium 141 134 - 144 mmol/L   Potassium 4.6 3.5 - 5.2 mmol/L   Chloride 99 96 - 106 mmol/L   CO2 25 18 - 29 mmol/L    Comment: **Effective May 15, 2017 Carbon Dioxide, Total**   reference interval will be changing to:              Age                  Female  Female      0 days   - 30 days         16 - 29        16 - 29     31 days   -  1 year         15 - 25        15 - 25      2 years  -  5 years        17 - 26        17 - 26      6 years  - 12 years        40 - 69        19 - 55                >12 years        20 - 28        20 - 29    Calcium 9.2 8.7 - 10.2 mg/dL   Total Protein 7.3 6.0 - 8.5 g/dL   Albumin 4.4 3.5 - 5.5 g/dL   Globulin, Total 2.9 1.5 - 4.5 g/dL   Albumin/Globulin Ratio 1.5 1.2 - 2.2   Bilirubin Total 0.3 0.0 - 1.2 mg/dL   Alkaline Phosphatase 65 39 - 117 IU/L   AST 17 0 - 40 IU/L   ALT 28 0 - 32 IU/L  Hemoglobin A1c     Status: Abnormal   Collection Time: 05/10/17  8:52 AM  Result Value Ref Range   Hgb A1c MFr Bld 5.7 (H) 4.8 - 5.6 %    Comment:          Pre-diabetes: 5.7 - 6.4          Diabetes: >6.4          Glycemic control for adults with diabetes: <7.0    Est. average glucose Bld gHb Est-mCnc 117 mg/dL  Lipid panel     Status: None   Collection Time: 05/10/17  8:52 AM  Result Value Ref Range   Cholesterol, Total 193 100 - 199 mg/dL   Triglycerides 52 0 - 149 mg/dL   HDL 88 >16 mg/dL   VLDL Cholesterol Cal 10 5 - 40 mg/dL   LDL Calculated 95 0 - 99 mg/dL   Chol/HDL Ratio 2.2 0.0 - 4.4 ratio    Comment:                                   T. Chol/HDL Ratio                                             Men  Women                               1/2 Avg.Risk  3.4    3.3                                   Avg.Risk  5.0    4.4  2X Avg.Risk  9.6    7.1                                3X Avg.Risk 23.4   11.0   VITAMIN D 25 Hydroxy  (Vit-D Deficiency, Fractures)     Status: None   Collection Time: 05/10/17  8:52 AM  Result Value Ref Range   Vit D, 25-Hydroxy 32.2 30.0 - 100.0 ng/mL    Comment: Vitamin D deficiency has been defined by the Institute of Medicine and an Endocrine Society practice guideline as a level of serum 25-OH vitamin D less than 20 ng/mL (1,2). The Endocrine Society went on to further define vitamin D insufficiency as a level between 21 and 29 ng/mL (2). 1. IOM (Institute of Medicine). 2010. Dietary reference    intakes for calcium and D. [place]: The    [employer]. 2. Holick MF, Binkley [place], Bischoff-Ferrari HA, et al.    Evaluation, treatment, and prevention of vitamin D    deficiency: an Endocrine Society clinical practice    guideline. JCEM. 2011 Jul; 96(7):1911-30.   Vitamin B12     Status: None   Collection Time: 05/10/17  8:52 AM  Result Value Ref Range   Vitamin B-12 429 232 - 1,245 pg/mL  TSH     Status: None   Collection Time: 05/10/17  8:52 AM  Result Value Ref Range   TSH 2.270 0.450 - 4.500 uIU/mL  T4, free     Status: None   Collection Time: 05/10/17  8:52 AM  Result Value Ref Range   Free T4 1.11 0.82 - 1.77 ng/dL  Magnesium     Status: Abnormal   Collection Time: 05/10/17  8:52 AM  Result Value Ref Range   Magnesium 2.4 (H) 1.6 - 2.3 mg/dL  Phosphorus     Status: None   Collection Time: 05/10/17  8:52 AM  Result Value Ref Range   Phosphorus 3.4 2.5 - 4.5 mg/dL

## 2017-07-04 ENCOUNTER — Ambulatory Visit (INDEPENDENT_AMBULATORY_CARE_PROVIDER_SITE_OTHER): Payer: BLUE CROSS/BLUE SHIELD | Admitting: Family Medicine

## 2017-07-04 VITALS — BP 116/76 | HR 75 | Temp 98.9°F

## 2017-07-04 DIAGNOSIS — Z23 Encounter for immunization: Secondary | ICD-10-CM

## 2017-07-04 DIAGNOSIS — Z1239 Encounter for other screening for malignant neoplasm of breast: Secondary | ICD-10-CM

## 2017-07-04 DIAGNOSIS — Z1231 Encounter for screening mammogram for malignant neoplasm of breast: Secondary | ICD-10-CM

## 2017-07-04 NOTE — Progress Notes (Signed)
  Julie NakayamaKathy Garcia presents for immunizations.    Screening questions for immunizations: 1. Are you sick today?  no 2. Do you have allergies to medications, foods, or any vaccines?  no 3. Have you ever had a serious reaction after receiving a vaccination?  no 4. Do you have a long-term health problem with heart disease, asthma, lung disease, kidney disease, metabolic disease (e.g. diabetes), anemia, or other blood disorder?  no 5. Have you had a seizure, brain problem, or other nervous system problem?  no 6. Do you have cancer, leukemia, AIDS, or any other immune system problem?  no 7. Do you take cortisone, prednisone, other steroids, anticancer drugs or have you had radiation treatments?  no 8. Have you received a transfusion of blood or blood products, or been given immune (gamma) globulin or an antiviral drug in the past year?  no 9. Have you received vaccinations in the past 4 weeks?  no 10. FEMALES ONLY: Are you pregnant or is there a chance you could become pregnant during the next month?  no

## 2017-09-06 ENCOUNTER — Ambulatory Visit
Admission: RE | Admit: 2017-09-06 | Discharge: 2017-09-06 | Disposition: A | Discharge status: Home / Self Care | Payer: BLUE CROSS/BLUE SHIELD | Source: Ambulatory Visit | Attending: Family Medicine | Admitting: Family Medicine

## 2017-09-06 DIAGNOSIS — Z1231 Encounter for screening mammogram for malignant neoplasm of breast: Secondary | ICD-10-CM | POA: Diagnosis not present

## 2017-09-06 DIAGNOSIS — Z1239 Encounter for other screening for malignant neoplasm of breast: Secondary | ICD-10-CM

## 2017-10-09 ENCOUNTER — Ambulatory Visit (INDEPENDENT_AMBULATORY_CARE_PROVIDER_SITE_OTHER): Payer: BLUE CROSS/BLUE SHIELD

## 2017-10-09 VITALS — BP 107/72 | HR 90

## 2017-10-09 DIAGNOSIS — Z23 Encounter for immunization: Secondary | ICD-10-CM

## 2017-10-09 NOTE — Progress Notes (Signed)
Pt here for influenza vaccine.  Screening questionnaire reviewed, VIS provided to patient, and any/all patient questions answered.  T. Nelson, CMA  

## 2017-11-20 ENCOUNTER — Ambulatory Visit: Payer: BLUE CROSS/BLUE SHIELD | Admitting: Family Medicine

## 2018-09-28 DIAGNOSIS — H5213 Myopia, bilateral: Secondary | ICD-10-CM | POA: Diagnosis not present

## 2018-10-04 ENCOUNTER — Other Ambulatory Visit: Payer: Self-pay | Admitting: Family Medicine

## 2018-10-04 DIAGNOSIS — Z1231 Encounter for screening mammogram for malignant neoplasm of breast: Secondary | ICD-10-CM

## 2018-11-16 ENCOUNTER — Ambulatory Visit
Admission: RE | Admit: 2018-11-16 | Discharge: 2018-11-16 | Disposition: A | Discharge status: Home / Self Care | Payer: BLUE CROSS/BLUE SHIELD | Source: Ambulatory Visit | Attending: Family Medicine | Admitting: Family Medicine

## 2018-11-16 DIAGNOSIS — Z1231 Encounter for screening mammogram for malignant neoplasm of breast: Secondary | ICD-10-CM

## 2018-11-20 ENCOUNTER — Other Ambulatory Visit: Payer: Self-pay | Admitting: Family Medicine

## 2018-11-20 DIAGNOSIS — R928 Other abnormal and inconclusive findings on diagnostic imaging of breast: Secondary | ICD-10-CM

## 2018-11-23 ENCOUNTER — Ambulatory Visit: Payer: BLUE CROSS/BLUE SHIELD

## 2018-11-23 ENCOUNTER — Ambulatory Visit
Admission: RE | Admit: 2018-11-23 | Discharge: 2018-11-23 | Disposition: A | Discharge status: Home / Self Care | Payer: BLUE CROSS/BLUE SHIELD | Source: Ambulatory Visit | Attending: Family Medicine | Admitting: Family Medicine

## 2018-11-23 DIAGNOSIS — R922 Inconclusive mammogram: Secondary | ICD-10-CM | POA: Diagnosis not present

## 2018-11-23 DIAGNOSIS — R928 Other abnormal and inconclusive findings on diagnostic imaging of breast: Secondary | ICD-10-CM

## 2019-11-04 ENCOUNTER — Other Ambulatory Visit: Payer: Self-pay | Admitting: Family Medicine

## 2019-11-04 DIAGNOSIS — Z1231 Encounter for screening mammogram for malignant neoplasm of breast: Secondary | ICD-10-CM

## 2019-12-26 ENCOUNTER — Ambulatory Visit
Admission: RE | Admit: 2019-12-26 | Discharge: 2019-12-26 | Disposition: A | Discharge status: Home / Self Care | Payer: BLUE CROSS/BLUE SHIELD | Source: Ambulatory Visit | Attending: Family Medicine | Admitting: Family Medicine

## 2019-12-26 ENCOUNTER — Other Ambulatory Visit: Payer: Self-pay

## 2019-12-26 DIAGNOSIS — Z1231 Encounter for screening mammogram for malignant neoplasm of breast: Secondary | ICD-10-CM | POA: Diagnosis not present

## 2019-12-30 ENCOUNTER — Other Ambulatory Visit: Payer: Self-pay | Admitting: Family Medicine

## 2019-12-30 DIAGNOSIS — R928 Other abnormal and inconclusive findings on diagnostic imaging of breast: Secondary | ICD-10-CM

## 2020-01-07 ENCOUNTER — Other Ambulatory Visit: Payer: Self-pay

## 2020-01-07 ENCOUNTER — Ambulatory Visit
Admission: RE | Admit: 2020-01-07 | Discharge: 2020-01-07 | Disposition: A | Discharge status: Home / Self Care | Payer: BC Managed Care – PPO | Source: Ambulatory Visit | Attending: Family Medicine | Admitting: Family Medicine

## 2020-01-07 DIAGNOSIS — R922 Inconclusive mammogram: Secondary | ICD-10-CM | POA: Diagnosis not present

## 2020-01-07 DIAGNOSIS — N6011 Diffuse cystic mastopathy of right breast: Secondary | ICD-10-CM | POA: Diagnosis not present

## 2020-01-07 DIAGNOSIS — R928 Other abnormal and inconclusive findings on diagnostic imaging of breast: Secondary | ICD-10-CM

## 2020-01-30 ENCOUNTER — Ambulatory Visit: Payer: BC Managed Care – PPO | Attending: Internal Medicine

## 2020-01-30 DIAGNOSIS — Z23 Encounter for immunization: Secondary | ICD-10-CM

## 2020-01-30 NOTE — Progress Notes (Signed)
   Covid-19 Vaccination Clinic  Name:  Julie Garcia    MRN: [id number] DOB: October 18, 1971  01/30/2020  Julie Garcia was observed post Covid-19 immunization for 15 minutes without incidence. She was provided with Vaccine Information Sheet and instruction to access the V-Safe system.   Julie Garcia was instructed to call 911 with any severe reactions post vaccine: Marland Kitchen Difficulty breathing  . Swelling of your face and throat  . A fast heartbeat  . A bad rash all over your body  . Dizziness and weakness    Immunizations Administered    Name Date Dose VIS Date Route   Pfizer COVID-19 Vaccine 01/30/2020 11:43 AM 0.3 mL 11/15/2019 Intramuscular   Manufacturer: [employer], [employer]   Lot: [id number]   NDC: [address]-7

## 2020-02-25 ENCOUNTER — Ambulatory Visit: Payer: BC Managed Care – PPO | Attending: Internal Medicine

## 2020-02-25 DIAGNOSIS — Z23 Encounter for immunization: Secondary | ICD-10-CM

## 2020-02-25 NOTE — Progress Notes (Signed)
   Covid-19 Vaccination Clinic  Name:  NYELI HOLTMEYER    MRN: [id number] DOB: Feb 07, 1971  02/25/2020  Ms. Krenz was observed post Covid-19 immunization for 15 minutes without incident. She was provided with Vaccine Information Sheet and instruction to access the V-Safe system.   Ms. Transue was instructed to call 911 with any severe reactions post vaccine: Marland Kitchen Difficulty breathing  . Swelling of face and throat  . A fast heartbeat  . A bad rash all over body  . Dizziness and weakness   Immunizations Administered    Name Date Dose VIS Date Route   Pfizer COVID-19 Vaccine 02/25/2020  8:54 AM 0.3 mL 11/15/2019 Intramuscular   Manufacturer: [employer], [employer]   Lot: [id number]   NDC: [address]-6

## 2020-04-21 ENCOUNTER — Encounter: Payer: Self-pay | Admitting: Physician Assistant

## 2020-04-21 ENCOUNTER — Ambulatory Visit: Payer: BC Managed Care – PPO | Admitting: Physician Assistant

## 2020-04-21 ENCOUNTER — Other Ambulatory Visit: Payer: Self-pay

## 2020-04-21 VITALS — BP 118/68 | HR 73 | Temp 98.3°F | Wt 179.2 lb

## 2020-04-21 DIAGNOSIS — Z111 Encounter for screening for respiratory tuberculosis: Secondary | ICD-10-CM | POA: Diagnosis not present

## 2020-04-21 DIAGNOSIS — H6593 Unspecified nonsuppurative otitis media, bilateral: Secondary | ICD-10-CM | POA: Diagnosis not present

## 2020-04-21 DIAGNOSIS — J3089 Other allergic rhinitis: Secondary | ICD-10-CM | POA: Diagnosis not present

## 2020-04-21 DIAGNOSIS — Z0000 Encounter for general adult medical examination without abnormal findings: Secondary | ICD-10-CM

## 2020-04-21 NOTE — Progress Notes (Signed)
Established Patient Office Visit  Subjective:  Patient ID: Julie Garcia, female    DOB: December 09, 1970  Age: 49 y.o. MRN: [id number]  CC: No chief complaint on file.   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 She is doing well and has no concerns today. Pt's PMHx is noncontributory. She is UTD on Tdap.  History reviewed. No pertinent past medical history.  Past Surgical History:  Procedure Laterality Date  . CESAREAN SECTION    . PARTIAL HYSTERECTOMY    . TONSILLECTOMY      Family History  Problem Relation Age of Onset  . Pulmonary embolism Father   . Hyperlipidemia Father   . Pulmonary embolism Brother   . Depression Brother   . Pancreatic cancer Paternal Grandmother   . Endometriosis Mother   . Gallbladder disease Mother   . Breast cancer Neg Hx     Social History   Socioeconomic History  . Marital status: Married    Spouse name: Not on file  . Number of children: 3  . Years of education: Not on file  . Highest education level: Not on file  Occupational History  . Not on file  Tobacco Use  . Smoking status: Never Smoker  . Smokeless tobacco: Never Used  Substance and Sexual Activity  . Alcohol use: Yes  . Drug use: No  . Sexual activity: Yes    Partners: Male    Comment: Married-David  Other Topics Concern  . Not on file  Social History Narrative  . Not on file   Social Determinants of Health   Financial Resource Strain:   . Difficulty of Paying Living Expenses:   Food Insecurity:   . Worried About Charity fundraiser in the Last Year:   . Arboriculturist in the Last Year:   Transportation Needs:   . Film/video editor (Medical):   Marland Kitchen Lack of Transportation (Non-Medical):   Physical Activity:   . Days of Exercise per Week:   . Minutes of Exercise per Session:   Stress:   . Feeling of Stress :   Social Connections:   . Frequency of Communication with Friends  and Family:   . Frequency of Social Gatherings with Friends and Family:   . Attends Religious Services:   . Active Member of Clubs or Organizations:   . Attends Archivist Meetings:   Marland Kitchen Marital Status:   Intimate Partner Violence:   . Fear of Current or Ex-Partner:   . Emotionally Abused:   Marland Kitchen Physically Abused:   . Sexually Abused:     Outpatient Medications Prior to Visit  Medication Sig Dispense Refill  . Aspirin (ASPIR-81 PO) Take by mouth daily.    . cetirizine (ZYRTEC) 10 MG tablet Take 10 mg by mouth as needed for allergies.     No facility-administered medications prior to visit.    No Known Allergies  ROS Review of Systems Review of Systems: General: Denies fever, chills, unexplained weight loss.  Optho/Auditory: Denies visual changes, blurred vision/LOV Respiratory: Denies SOB, DOE, cough  Cardiovascular: Denies chest pain, palpitations, new onset peripheral edema  Gastrointestinal: Denies nausea, vomiting, diarrhea.  Genitourinary: Denies dysuria, freq/ urgency, flank pain  Endocrine:  Denies hot or cold intolerance, polyuria, polydipsia. Musculoskeletal:  Denies unexplained myalgias, joint swelling, unexplained arthralgias, gait problems.  Skin: Denies rash, suspicious lesions Neurological:  Denies dizziness, unexplained weakness, numbness  Psychiatric/Behavioral: Denies  mood changes, suicidal or homicidal ideations, hallucinations   Objective:    Physical Exam General:  Well Developed, well nourished, appropriate for stated age.  Neuro:  Alert and oriented,  extra-ocular muscles intact  HEENT:  Normocephalic, atraumatic, neck supple, no carotid bruits appreciated. B/L middle ear effusion noted. Normal external ear canals, both ears.  Skin:  no gross rash, warm, pink. Cardiac:  RRR, S1 S2 Respiratory:  ECTA B/L with rhonchi on L upper lobe, no wheezing, crackles or rales. Not using accessory muscles, speaking in full sentences- unlabored. MSK:  Full ROM, 5/5 strength of UE and LE, normal gait.  Vascular:  Ext warm, no cyanosis apprec.; cap RF less 2 sec. Psych:  No HI/SI, judgement and insight good, Euthymic mood. Full Affect.   BP 118/68   Pulse 73   Temp 98.3 F (36.8 C) (Oral)   Wt 179 lb 3.2 oz (81.3 kg)   SpO2 99% Comment: on RA  BMI 26.46 kg/m  Wt Readings from Last 3 Encounters:  04/21/20 179 lb 3.2 oz (81.3 kg)  05/18/17 186 lb 6.4 oz (84.6 kg)  05/02/17 188 lb (85.3 kg)     Health Maintenance Due  Topic Date Due  . PAP SMEAR-Modifier  04/26/2019    There are no preventive care reminders to display for this patient.  Lab Results  Component Value Date   TSH 2.270 05/10/2017   Lab Results  Component Value Date   WBC 6.7 05/10/2017   HGB 13.7 05/10/2017   HCT 40.6 05/10/2017   MCV 96 05/10/2017   PLT 243 05/10/2017   Lab Results  Component Value Date   NA 141 05/10/2017   K 4.6 05/10/2017   CO2 25 05/10/2017   GLUCOSE 92 05/10/2017   BUN 11 05/10/2017   CREATININE 0.85 05/10/2017   BILITOT 0.3 05/10/2017   ALKPHOS 65 05/10/2017   AST 17 05/10/2017   ALT 28 05/10/2017   PROT 7.3 05/10/2017   ALBUMIN 4.4 05/10/2017   CALCIUM 9.2 05/10/2017   Lab Results  Component Value Date   CHOL 193 05/10/2017   Lab Results  Component Value Date   HDL 88 05/10/2017   Lab Results  Component Value Date   LDLCALC 95 05/10/2017   Lab Results  Component Value Date   TRIG 52 05/10/2017   Lab Results  Component Value Date   CHOLHDL 2.2 05/10/2017   Lab Results  Component Value Date   HGBA1C 5.7 (H) 05/10/2017      Assessment & Plan:   Problem List Items Addressed This Visit      Other   Environmental and seasonal allergies (Chronic)    Other Visit Diagnoses    Healthcare maintenance    -  Primary   Screening for tuberculosis       Relevant Orders   PPD     Healthcare Maintenance: - No abnormal or concerning PE findings, vital signs within normal limits, and Tdap UTD so patient is  cleared for job position. - PPD administered and patient instructed to return in 48-72 hours for reading.  Environmental and seasonal allergies: - Rhonchi and bilateral ear effusions are most likely related to allergies, and recommend to continue allergy medication.    No orders of the defined types were placed in this encounter.   Follow-up: Return in about 1 year (around 04/21/2021) for CPE; Return in 48-72 hours for PPD reading.    Mayer Masker, PA-C

## 2020-04-23 ENCOUNTER — Ambulatory Visit: Payer: BC Managed Care – PPO

## 2020-04-23 ENCOUNTER — Other Ambulatory Visit: Payer: Self-pay

## 2020-04-23 LAB — TB SKIN TEST
Induration: 0 mm
TB Skin Test: NEGATIVE

## 2020-12-18 ENCOUNTER — Other Ambulatory Visit: Payer: Self-pay | Admitting: Physician Assistant

## 2020-12-18 DIAGNOSIS — Z0000 Encounter for general adult medical examination without abnormal findings: Secondary | ICD-10-CM

## 2020-12-24 ENCOUNTER — Other Ambulatory Visit: Payer: Self-pay

## 2020-12-24 ENCOUNTER — Other Ambulatory Visit: Payer: 59

## 2020-12-24 DIAGNOSIS — Z0000 Encounter for general adult medical examination without abnormal findings: Secondary | ICD-10-CM

## 2020-12-25 LAB — LIPID PANEL
Chol/HDL Ratio: 2.1 ratio (ref 0.0–4.4)
Cholesterol, Total: 215 mg/dL — ABNORMAL HIGH (ref 100–199)
HDL: 102 mg/dL (ref >39)
LDL Chol Calc (NIH): 102 mg/dL — ABNORMAL HIGH (ref 0–99)
Triglycerides: 61 mg/dL (ref 0–149)
VLDL Cholesterol Cal: 11 mg/dL (ref 5–40)

## 2020-12-25 LAB — COMPREHENSIVE METABOLIC PANEL
ALT: 22 IU/L (ref 0–32)
AST: 22 IU/L (ref 0–40)
Albumin/Globulin Ratio: 1.7 (ref 1.2–2.2)
Albumin: 4.6 g/dL (ref 3.8–4.8)
Alkaline Phosphatase: 71 IU/L (ref 44–121)
BUN/Creatinine Ratio: 11 (ref 9–23)
BUN: 10 mg/dL (ref 6–24)
Bilirubin Total: 0.5 mg/dL (ref 0.0–1.2)
CO2: 22 mmol/L (ref 20–29)
Calcium: 9.5 mg/dL (ref 8.7–10.2)
Chloride: 102 mmol/L (ref 96–106)
Creatinine, Ser: 0.92 mg/dL (ref 0.57–1.00)
GFR calc Af Amer: 85 mL/min/{1.73_m2} (ref >59)
GFR calc non Af Amer: 74 mL/min/{1.73_m2} (ref >59)
Globulin, Total: 2.7 g/dL (ref 1.5–4.5)
Glucose: 109 mg/dL — ABNORMAL HIGH (ref 65–99)
Potassium: 4.6 mmol/L (ref 3.5–5.2)
Sodium: 142 mmol/L (ref 134–144)
Total Protein: 7.3 g/dL (ref 6.0–8.5)

## 2020-12-25 LAB — CBC
Hematocrit: 41.3 % (ref 34.0–46.6)
Hemoglobin: 14.2 g/dL (ref 11.1–15.9)
MCH: 32.9 pg (ref 26.6–33.0)
MCHC: 34.4 g/dL (ref 31.5–35.7)
MCV: 96 fL (ref 79–97)
Platelets: 232 10*3/uL (ref 150–450)
RBC: 4.32 x10E6/uL (ref 3.77–5.28)
RDW: 12.9 % (ref 11.7–15.4)
WBC: 6.3 10*3/uL (ref 3.4–10.8)

## 2020-12-25 LAB — TSH: TSH: 2.35 u[IU]/mL (ref 0.450–4.500)

## 2020-12-25 LAB — HEMOGLOBIN A1C
Est. average glucose Bld gHb Est-mCnc: 123 mg/dL
Hgb A1c MFr Bld: 5.9 % — ABNORMAL HIGH (ref 4.8–5.6)

## 2020-12-29 ENCOUNTER — Other Ambulatory Visit (HOSPITAL_COMMUNITY)
Admission: RE | Admit: 2020-12-29 | Discharge: 2020-12-29 | Disposition: A | Discharge status: Home / Self Care | Payer: 59 | Source: Ambulatory Visit | Attending: Physician Assistant | Admitting: Physician Assistant

## 2020-12-29 ENCOUNTER — Other Ambulatory Visit: Payer: Self-pay

## 2020-12-29 ENCOUNTER — Encounter: Payer: Self-pay | Admitting: Physician Assistant

## 2020-12-29 ENCOUNTER — Ambulatory Visit (INDEPENDENT_AMBULATORY_CARE_PROVIDER_SITE_OTHER): Payer: 59 | Admitting: Physician Assistant

## 2020-12-29 VITALS — BP 106/70 | HR 76 | Ht 69.0 in | Wt 182.6 lb

## 2020-12-29 DIAGNOSIS — Z0000 Encounter for general adult medical examination without abnormal findings: Secondary | ICD-10-CM | POA: Diagnosis not present

## 2020-12-29 DIAGNOSIS — Z124 Encounter for screening for malignant neoplasm of cervix: Secondary | ICD-10-CM | POA: Diagnosis not present

## 2020-12-29 DIAGNOSIS — E7800 Pure hypercholesterolemia, unspecified: Secondary | ICD-10-CM

## 2020-12-29 DIAGNOSIS — Q709 Syndactyly, unspecified: Secondary | ICD-10-CM

## 2020-12-29 DIAGNOSIS — M79661 Pain in right lower leg: Secondary | ICD-10-CM | POA: Diagnosis not present

## 2020-12-29 DIAGNOSIS — R7302 Impaired glucose tolerance (oral): Secondary | ICD-10-CM | POA: Diagnosis not present

## 2020-12-29 NOTE — Progress Notes (Signed)
Female Physical   Impression and Recommendations:    1. Healthcare maintenance   2. Screening for cervical cancer   3. Right calf pain   4. Syndactyly of toes   5. Glucose intolerance (impaired glucose tolerance)   6. Elevated LDL cholesterol level      1) Anticipatory Guidance: Skin CA prevention- recommend to use sunscreen when outside along with skin surveillance; eating a balanced and modest diet; physical activity at least 25 minutes per day or minimum of 150 min/ week moderate to intense activity.  2) Immunizations / Screenings / Labs:   All immunizations are up-to-date per recommendations or will be updated today if pt allows.    - Patient understands with dental and vision screens they will schedule independently.  - Obtained CBC, CMP, HgA1c, Lipid panel, and TSH when fasting, if not already done past 12 mo/ recently. Most labs are essentially within normal limits or stable from prior with the exception of lipid panel and A1c. -Placed orders for colonoscopy, pap smear. -UTD on Tdap and influenza vaccine. -Declined Hep C and HIV screenings. -Completed Covid vaccines.  3) Weight:  Recommend to continue to improve diet habits to improve overall feelings of well being and objective health data. Improve nutrient density of diet through increasing intake of fruits and vegetables and decreasing saturated fats, white flour products and refined sugars.  4) Healthcare Maintenance: -Will place order for doppler ultrasound to r/o DVT for right calf pain with mild swelling.  -Discussed with patient potential etiologies for paresthesias and declined testing for Vit B12 and folate. Will start taking a vitamin B12 supplement and plans to discontinue if symptoms fail to improve. Recommend further evaluation such as nerve conduction study if symptoms fail to improve or worsen. Wear good support shoes. -Placed referral to podiatry. -Recommend to follow a diet low in saturated and trans fats,  carbohydrates and glucose. Continue physical activity. Stay well hydrated. -Follow up in 6 months for HLD, PreDM and FBW (lipid panel, a1c) a few days prior visit.    No orders of the defined types were placed in this encounter.   Orders Placed This Encounter  Procedures  . Ambulatory referral to Podiatry  . VAS [place] LOWER EXTREMITY VENOUS (DVT)     Return in about 6 months (around 06/28/2021) for HLD, Pre DM with lipid/a1c labs few days before .     Gross side effects, risk and benefits, and alternatives of medications discussed with patient.  Patient is aware that all medications have potential side effects and we are unable to predict every side effect or drug-drug interaction that may occur.  Expresses verbal understanding and consents to current therapy plan and treatment regimen.  F-up preventative CPE in 1 year- reminded pt again, this is in addition to any chronic care visits.    Please see orders placed and AVS handed out to patient at the end of our visit for further patient instructions/ counseling done pertaining to today's office visit.      Subjective:     CPE HPI: Julie Garcia is a 50 y.o. female who presents to [facility] today for a yearly health maintenance exam.   Health Maintenance Summary  - Reviewed and updated, unless pt declines services.  Last Cologuard or Colonoscopy:  Placed order for screening colonoscopy. 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 S/p partial hysterectomy.  STD concerns:   none Birth control method:  S/p partial hysterectomy  Menses regular:  n/a Lumps or breast concerns:  none Breast Cancer Family History:  No    Additional concerns beyond health maintenance issues: Right calf pain x  couple days. Denies trauma/injury, recent immobilization or long travel, chest pain, shortness of breath, palpitations, or LE erythema. Reports family history of PE and is concerned about possible blood clot. Also has complaints of sharp and burning sensation of both feet, which usually occurs before bedtime.     Immunization History  Administered Date(s) Administered  . Influenza,inj,Quad PF,6+ Mos 10/09/2017  . Influenza-Unspecified 09/18/2020  . PFIZER Comirnaty(Gray Top)Covid-19 Tri-Sucrose Vaccine 09/18/2020  . PFIZER(Purple Top)SARS-COV-2 Vaccination 01/30/2020, 02/25/2020  . PPD Test 04/21/2020  . Tdap 07/04/2017     Health Maintenance  Topic Date Due  . Hepatitis C Screening  Never done  . COLONOSCOPY (Pts 45-57yrs Insurance coverage will need to be confirmed)  Never done  . PAP SMEAR-Modifier  04/26/2019  . HIV Screening  05/18/2029 (Originally 08/30/1987)  . TETANUS/TDAP  07/05/2027  . INFLUENZA VACCINE  Completed  . COVID-19 Vaccine  Completed     Wt Readings from Last 3 Encounters:  12/29/20 182 lb 9.6 oz (82.8 kg)  04/21/20 179 lb 3.2 oz (81.3 kg)  05/18/17 186 lb 6.4 oz (84.6 kg)   BP Readings from Last 3 Encounters:  12/29/20 106/70  04/21/20 118/68  10/09/17 107/72   Pulse Readings from Last 3 Encounters:  12/29/20 76  04/21/20 73  10/09/17 90     History reviewed. No pertinent past medical history.    Past Surgical History:  Procedure Laterality Date  . CESAREAN SECTION    . PARTIAL HYSTERECTOMY    . TONSILLECTOMY        Family History  Problem Relation Age of Onset  . Pulmonary embolism Father   . Hyperlipidemia Father   . Pulmonary embolism Brother   . Depression Brother   . Pancreatic cancer Paternal Grandmother   . Endometriosis Mother   . Gallbladder disease Mother   . Breast cancer Neg Hx       Social History   Substance and Sexual Activity  Drug Use No  ,   Social History   Substance and Sexual Activity  Alcohol  Use Yes  ,   Social History   Tobacco Use  Smoking Status Never Smoker  Smokeless Tobacco Never Used  ,   Social History   Substance and Sexual Activity  Sexual Activity Yes  . Partners: Male   Comment: Married-David    Current Outpatient Medications on File Prior to Visit  Medication Sig Dispense Refill  . Aspirin (ASPIR-81 PO) Take by mouth daily.    . cetirizine (ZYRTEC) 10 MG tablet Take 10 mg by mouth as needed for allergies.     No current facility-administered medications on file prior to visit.    Allergies: Patient has no known allergies.  Review of Systems: General:   Denies fever, chills, unexplained weight loss.  Optho/Auditory:   Denies visual changes, blurred vision/LOV Respiratory:   Denies SOB, DOE more than baseline levels.   Cardiovascular:   Denies chest pain, palpitations, new onset peripheral edema  Gastrointestinal:   Denies nausea, vomiting, diarrhea.  Genitourinary: Denies dysuria, freq/ urgency, flank pain or discharge from genitals.  Endocrine:  Denies hot or cold intolerance, polyuria, polydipsia. Musculoskeletal:   Denies unexplained myalgias, joint swelling, +right calf pain  Skin:  Denies rash, suspicious lesions Neurological:     Denies dizziness, unexplained weakness, +burning sensation of LE Psychiatric/Behavioral:   Denies mood changes, suicidal or homicidal ideations, hallucinations    Objective:    Blood pressure 106/70, pulse 76, height 5\' 9"  (1.753 m), weight 182 lb 9.6 oz (82.8 kg), SpO2 99 %. Body mass index is 26.97 kg/m. General Appearance:    Alert, cooperative, no distress, appears stated age  Head:    Normocephalic, without obvious abnormality, atraumatic  Eyes:    PERRL, conjunctiva/corneas clear, EOM's intact, both eyes  Ears:    Normal TM's and external ear canals, both ears  Nose:   Nares normal, septum midline, mucosa normal, no drainage    or sinus tenderness  Throat:   Lips w/o lesion, mucosa moist, and  tongue normal; teeth and   gums normal  Neck:   Supple, symmetrical, trachea midline, no adenopathy;    thyroid:  no enlargement/tenderness/nodules; no carotid   bruit or JVD  Back:     Symmetric, no curvature, ROM normal, no CVA tenderness  Lungs:     Clear to auscultation bilaterally, respirations unlabored, no       Wh/ R/ R  Chest Wall:    No tenderness or gross deformity; normal excursion   Heart:    Regular rate and rhythm, S1 and S2 normal, no murmur, rub   or gallop  Breast Exam:    Has upcoming mammogram.  Abdomen:     Soft, non-tender, bowel sounds active all four quadrants, No   G/R/R, no masses, no organomegaly  Genitalia:    Ext genitalia: without lesion, no rash, white discharge, No        tenderness;  Cervix: WNL's w/o discharge or lesion;        Adnexa:  No tenderness or palpable masses      Extremities:   Extremities normal, atraumatic, no cyanosis, mild edema or right lower extremity when compared to left extremity; slightly webbed toes noted   Pulses:   2+ and symmetric all extremities  Skin:   Warm, dry, Skin color, texture, turgor normal, no obvious rashes or lesions Psych: No HI/SI, judgement and insight good, Euthymic mood. Full Affect.  Neurologic:   CNII-XII grossly intact, normal strength, sensation and reflexes throughout

## 2020-12-29 NOTE — Patient Instructions (Signed)
Diabetes Mellitus and Nutrition, Adult When you have diabetes, or diabetes mellitus, it is very important to have healthy eating habits because your blood sugar (glucose) levels are greatly affected by what you eat and drink. Eating healthy foods in the right amounts, at about the same times every day, can help you:  Control your blood glucose.  Lower your risk of heart disease.  Improve your blood pressure.  Reach or maintain a healthy weight. What can affect my meal plan? Every person with diabetes is different, and each person has different needs for a meal plan. Your health care provider may recommend that you work with a dietitian to make a meal plan that is best for you. Your meal plan may vary depending on factors such as:  The calories you need.  The medicines you take.  Your weight.  Your blood glucose, blood pressure, and cholesterol levels.  Your activity level.  Other health conditions you have, such as heart or kidney disease. How do carbohydrates affect me? Carbohydrates, also called carbs, affect your blood glucose level more than any other type of food. Eating carbs naturally raises the amount of glucose in your blood. Carb counting is a method for keeping track of how many carbs you eat. Counting carbs is important to keep your blood glucose at a healthy level, especially if you use insulin or take certain oral diabetes medicines. It is important to know how many carbs you can safely have in each meal. This is different for every person. Your dietitian can help you calculate how many carbs you should have at each meal and for each snack. How does alcohol affect me? Alcohol can cause a sudden decrease in blood glucose (hypoglycemia), especially if you use insulin or take certain oral diabetes medicines. Hypoglycemia can be a life-threatening condition. Symptoms of hypoglycemia, such as sleepiness, dizziness, and confusion, are similar to symptoms of having too much  alcohol.  Do not drink alcohol if: ? Your health care provider tells you not to drink. ? You are pregnant, may be pregnant, or are planning to become pregnant.  If you drink alcohol: ? Do not drink on an empty stomach. ? Limit how much you use to:  0-1 drink a day for women.  0-2 drinks a day for men. ? Be aware of how much alcohol is in your drink. In the [place] one drink equals one 12 oz bottle of beer (355 mL), one 5 oz glass of wine (148 mL), or one 1 oz glass of hard liquor (44 mL). ? Keep yourself hydrated with water, diet soda, or unsweetened iced tea.  Keep in mind that regular soda, juice, and other mixers may contain a lot of sugar and must be counted as carbs. What are tips for following this plan? Reading food labels  Start by checking the serving size on the "Nutrition Facts" label of packaged foods and drinks. The amount of calories, carbs, fats, and other nutrients listed on the label is based on one serving of the item. Many items contain more than one serving per package.  Check the total grams (g) of carbs in one serving. You can calculate the number of servings of carbs in one serving by dividing the total carbs by 15. For example, if a food has 30 g of total carbs per serving, it would be equal to 2 servings of carbs.  Check the number of grams (g) of saturated fats and trans fats in one serving. Choose foods that have   a low amount or none of these fats.  Check the number of milligrams (mg) of salt (sodium) in one serving. Most people should limit total sodium intake to less than 2,300 mg per day.  Always check the nutrition information of foods labeled as "low-fat" or "nonfat." These foods may be higher in added sugar or refined carbs and should be avoided.  Talk to your dietitian to identify your daily goals for nutrients listed on the label. Shopping  Avoid buying canned, pre-made, or processed foods. These foods tend to be high in fat, sodium, and added  sugar.  Shop around the outside edge of the grocery store. This is where you will most often find fresh fruits and vegetables, bulk grains, fresh meats, and fresh dairy. Cooking  Use low-heat cooking methods, such as baking, instead of high-heat cooking methods like deep frying.  Cook using healthy oils, such as olive, canola, or sunflower oil.  Avoid cooking with butter, cream, or high-fat meats. Meal planning  Eat meals and snacks regularly, preferably at the same times every day. Avoid going long periods of time without eating.  Eat foods that are high in fiber, such as fresh fruits, vegetables, beans, and whole grains. Talk with your dietitian about how many servings of carbs you can eat at each meal.  Eat 4-6 oz (112-168 g) of lean protein each day, such as lean meat, chicken, fish, eggs, or tofu. One ounce (oz) of lean protein is equal to: ? 1 oz (28 g) of meat, chicken, or fish. ? 1 egg. ?  cup (62 g) of tofu.  Eat some foods each day that contain healthy fats, such as avocado, nuts, seeds, and fish.   What foods should I eat? Fruits Berries. Apples. Oranges. Peaches. Apricots. Plums. Grapes. Mango. Papaya. Pomegranate. Kiwi. Cherries. Vegetables Lettuce. Spinach. Leafy greens, including kale, chard, collard greens, and mustard greens. Beets. Cauliflower. Cabbage. Broccoli. Carrots. Green beans. Tomatoes. Peppers. Onions. Cucumbers. Brussels sprouts. Grains Whole grains, such as whole-wheat or whole-grain bread, crackers, tortillas, cereal, and pasta. Unsweetened oatmeal. Quinoa. Brown or wild rice. Meats and other proteins Seafood. Poultry without skin. Lean cuts of poultry and beef. Tofu. Nuts. Seeds. Dairy Low-fat or fat-free dairy products such as milk, yogurt, and cheese. The items listed above may not be a complete list of foods and beverages you can eat. Contact a dietitian for more information. What foods should I avoid? Fruits Fruits canned with  syrup. Vegetables Canned vegetables. Frozen vegetables with butter or cream sauce. Grains Refined white flour and flour products such as bread, pasta, snack foods, and cereals. Avoid all processed foods. Meats and other proteins Fatty cuts of meat. Poultry with skin. Breaded or fried meats. Processed meat. Avoid saturated fats. Dairy Full-fat yogurt, cheese, or milk. Beverages Sweetened drinks, such as soda or iced tea. The items listed above may not be a complete list of foods and beverages you should avoid. Contact a dietitian for more information. Questions to ask a health care provider  Do I need to meet with a diabetes educator?  Do I need to meet with a dietitian?  What number can I call if I have questions?  When are the best times to check my blood glucose? Where to find more information:  American Diabetes Association: diabetes.org  Academy of Nutrition and Dietetics: www.eatright.org  National Institute of Diabetes and Digestive and Kidney Diseases: www.niddk.nih.gov  Association of Diabetes Care and Education Specialists: www.diabeteseducator.org Summary  It is important to have healthy eating   habits because your blood sugar (glucose) levels are greatly affected by what you eat and drink.  A healthy meal plan will help you control your blood glucose and maintain a healthy lifestyle.  Your health care provider may recommend that you work with a dietitian to make a meal plan that is best for you.  Keep in mind that carbohydrates (carbs) and alcohol have immediate effects on your blood glucose levels. It is important to count carbs and to use alcohol carefully. This information is not intended to replace advice given to you by your health care provider. Make sure you discuss any questions you have with your health care provider. Document Revised: 10/29/2019 Document Reviewed: 10/29/2019 Elsevier Patient Education  2021 [employer]  

## 2020-12-30 ENCOUNTER — Other Ambulatory Visit: Payer: Self-pay | Admitting: Physician Assistant

## 2020-12-30 DIAGNOSIS — Z1231 Encounter for screening mammogram for malignant neoplasm of breast: Secondary | ICD-10-CM

## 2021-01-05 ENCOUNTER — Other Ambulatory Visit: Payer: Self-pay

## 2021-01-05 ENCOUNTER — Ambulatory Visit (HOSPITAL_COMMUNITY)
Admission: RE | Admit: 2021-01-05 | Discharge: 2021-01-05 | Disposition: A | Discharge status: Home / Self Care | Payer: 59 | Source: Ambulatory Visit | Attending: Physician Assistant | Admitting: Physician Assistant

## 2021-01-05 DIAGNOSIS — M79661 Pain in right lower leg: Secondary | ICD-10-CM | POA: Insufficient documentation

## 2021-01-06 LAB — CYTOLOGY - PAP: Diagnosis: NEGATIVE

## 2021-01-12 ENCOUNTER — Ambulatory Visit
Admission: RE | Admit: 2021-01-12 | Discharge: 2021-01-12 | Disposition: A | Discharge status: Home / Self Care | Payer: 59 | Source: Ambulatory Visit

## 2021-01-12 ENCOUNTER — Other Ambulatory Visit: Payer: Self-pay

## 2021-01-12 ENCOUNTER — Ambulatory Visit: Payer: 59 | Admitting: Podiatry

## 2021-01-12 DIAGNOSIS — Z1231 Encounter for screening mammogram for malignant neoplasm of breast: Secondary | ICD-10-CM

## 2021-01-19 ENCOUNTER — Other Ambulatory Visit: Payer: Self-pay

## 2021-01-19 ENCOUNTER — Ambulatory Visit (INDEPENDENT_AMBULATORY_CARE_PROVIDER_SITE_OTHER): Payer: 59 | Admitting: Podiatry

## 2021-01-19 ENCOUNTER — Ambulatory Visit (INDEPENDENT_AMBULATORY_CARE_PROVIDER_SITE_OTHER): Payer: 59

## 2021-01-19 DIAGNOSIS — G629 Polyneuropathy, unspecified: Secondary | ICD-10-CM | POA: Diagnosis not present

## 2021-01-19 DIAGNOSIS — M7751 Other enthesopathy of right foot: Secondary | ICD-10-CM | POA: Diagnosis not present

## 2021-01-19 DIAGNOSIS — M7752 Other enthesopathy of left foot: Secondary | ICD-10-CM | POA: Diagnosis not present

## 2021-02-09 ENCOUNTER — Ambulatory Visit: Payer: 59 | Admitting: Podiatry

## 2021-02-09 ENCOUNTER — Other Ambulatory Visit: Payer: Self-pay

## 2021-02-09 DIAGNOSIS — M7752 Other enthesopathy of left foot: Secondary | ICD-10-CM

## 2021-03-19 DIAGNOSIS — M7752 Other enthesopathy of left foot: Secondary | ICD-10-CM | POA: Diagnosis not present

## 2021-03-19 MED ORDER — BETAMETHASONE SOD PHOS & ACET 6 (3-3) MG/ML IJ SUSP
6.0000 mg | Freq: Once | INTRAMUSCULAR | Status: AC
  Administered 2021-03-19: 6 mg

## 2021-03-19 NOTE — Progress Notes (Signed)
  Subjective:  Patient ID: Julie Garcia, female    DOB: [birth year],  MRN: [id number]  No chief complaint on file.   50 y.o. female presents with the above complaint. History confirmed with patient.  Presents with pains to the balls of both feet states is worse with weightbearing present for several weeks  Objective:  Physical Exam: warm, good capillary refill, no trophic changes or ulcerative lesions, normal DP and PT pulses and normal sensory exam. Left Foot: POP 2nd MPJ no pain at the second or third interspace Right Foot: POP 2nd MPJ, no pain at the second or third interspace  No images are attached to the encounter.  Radiographs: X-ray of both feet: no fracture, dislocation, swelling or degenerative changes noted Assessment:   1. Neuropathy   2. Capsulitis of metatarsophalangeal (MTP) joints of both feet      Plan:  Patient was evaluated and treated and all questions answered.  Capsulitis -Educated on etiology -XR reviewed with patient -Injection delivered to the painful joint  Procedure: Joint Injection Location: Left 2nd MPJ joint Skin Prep: Alcohol. Injectate: 0.5 cc 1% lidocaine plain, 1 cc betamethasone acetate-betamethasone sodium phosphate Disposition: Patient tolerated procedure well. Injection site dressed with a band-aid.   No follow-ups on file.

## 2021-03-19 NOTE — Progress Notes (Signed)
  Subjective:  Patient ID: Julie Garcia, female    DOB: 12-15-1970,  MRN: [id number]  No chief complaint on file.   50 y.o. female presents for follow-up states that the injection helped her feet are about 80% better than they were last visit pain is about a 3 out of 10 today were last time was about 7 or 8 out of 10.  Objective:  Physical Exam: warm, good capillary refill, no trophic changes or ulcerative lesions, normal DP and PT pulses and normal sensory exam. Left Foot: Mild pain palpation at the second MPJ   Assessment:   1. Capsulitis of metatarsophalangeal (MTP) joint of left foot    Plan:  Patient was evaluated and treated and all questions answered.  Capsulitis -Patient feels that it is improving so we will hold off injection today should pain persist would consider repeat injection  No follow-ups on file.

## 2021-05-11 IMAGING — MG DIGITAL SCREENING BILAT W/ TOMO W/ CAD
6 of 10 series · 6 of 30 positions shown · non-contrast
Comparison: Previous exam(s).

CLINICAL DATA: Screening.

EXAM:
DIGITAL SCREENING BILATERAL MAMMOGRAM WITH TOMO AND CAD

[R CC synth-2D]
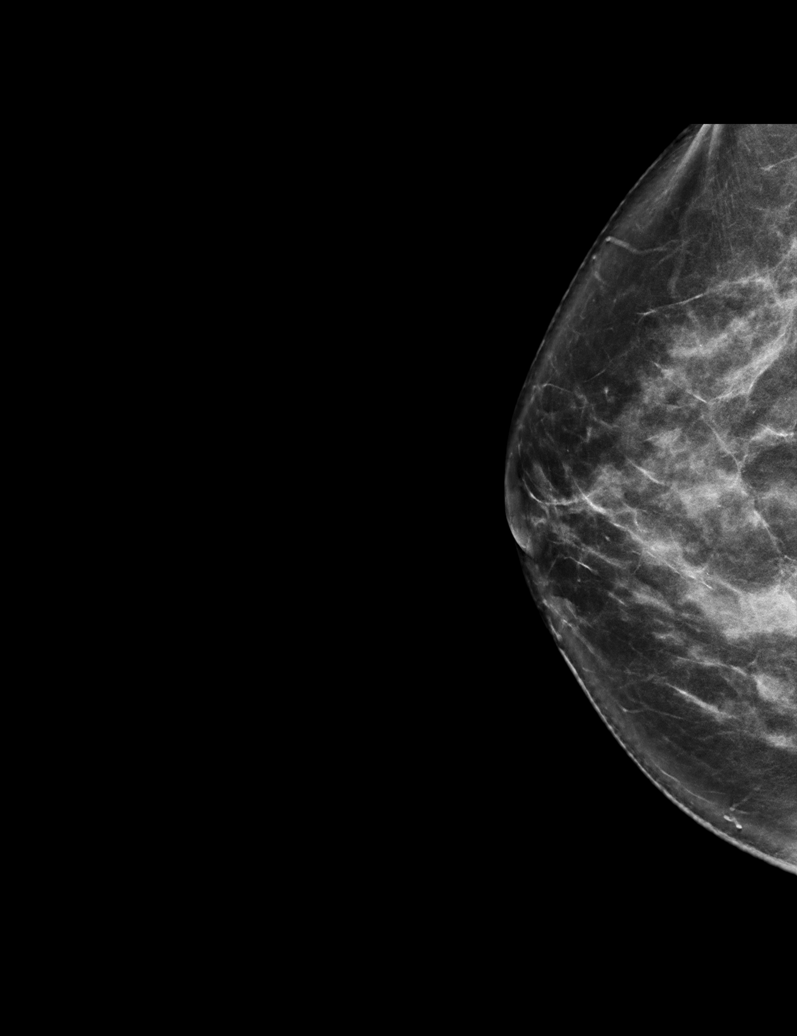

[L CC synth-2D]
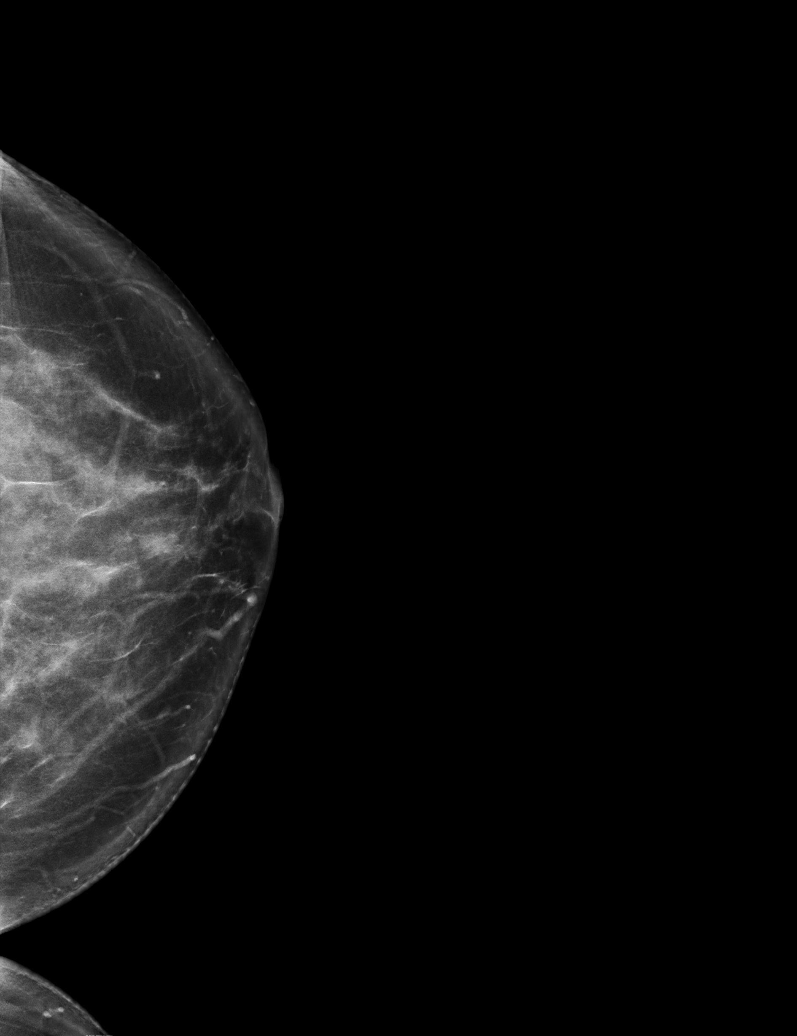

[L MLO synth-2D (1 of 2)]
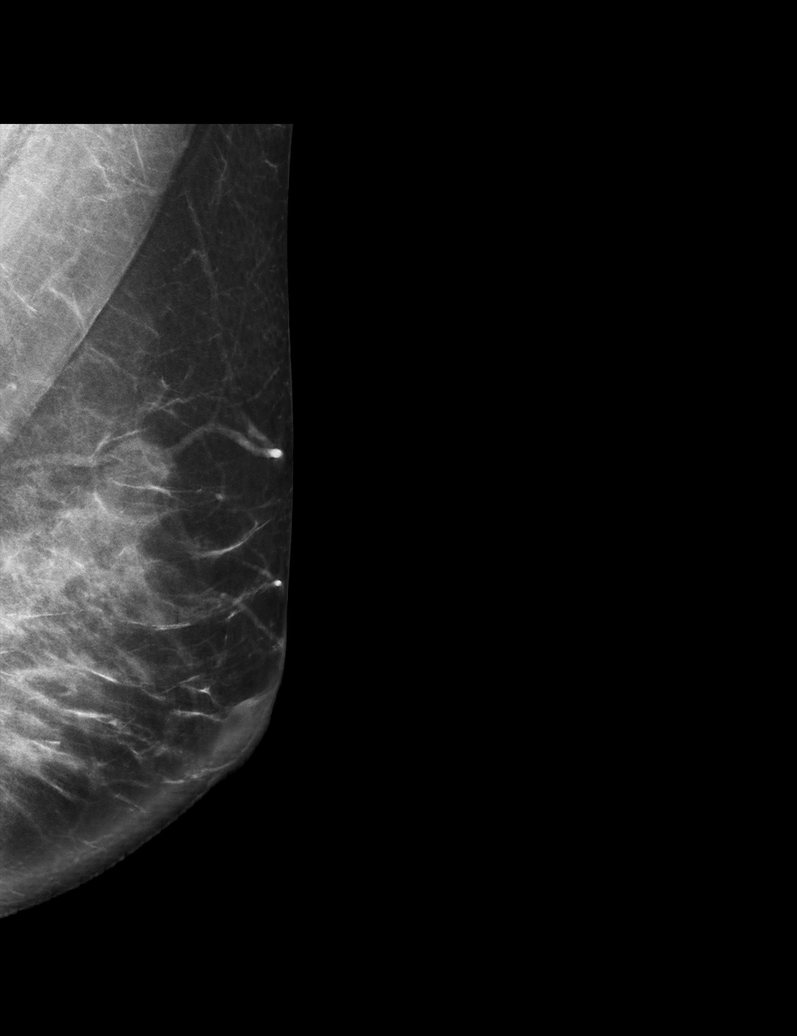

[L MLO synth-2D (2 of 2)]
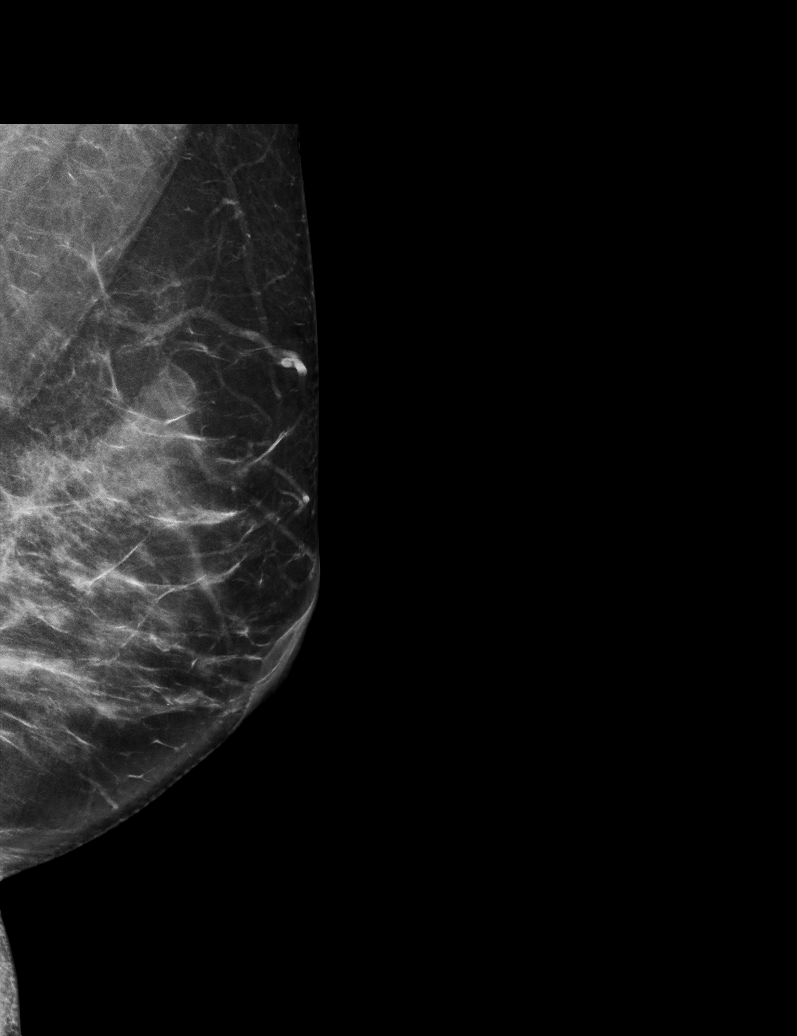

[R MLO synth-2D]
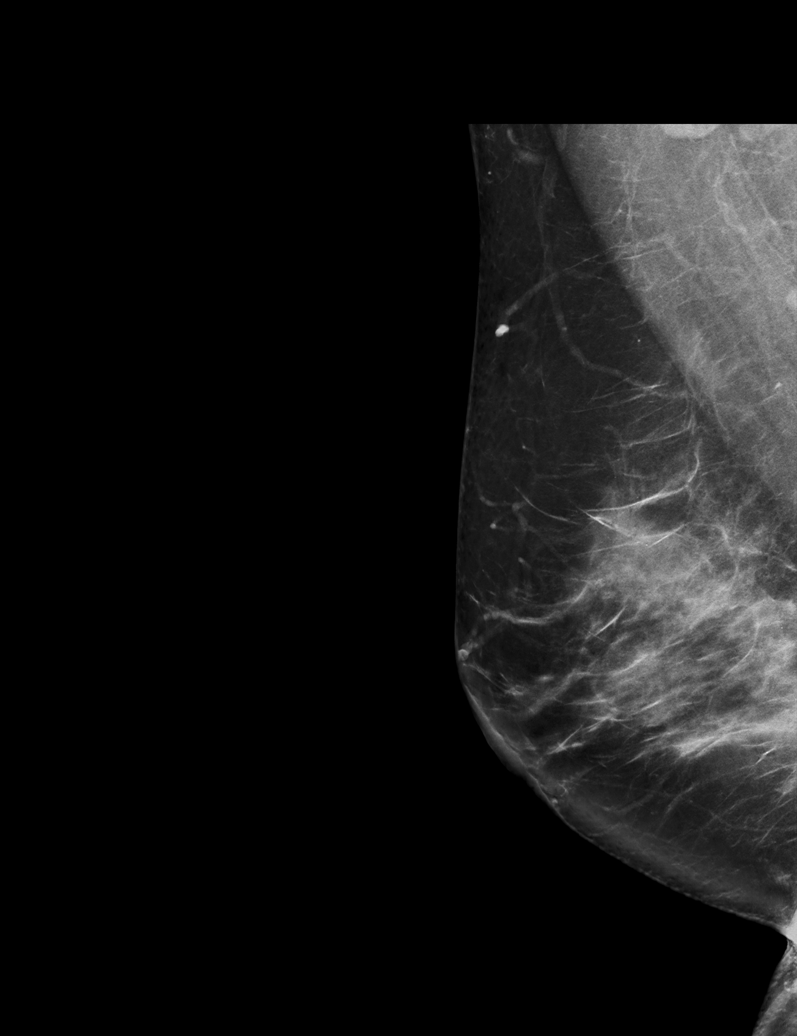

[R MLO tomo · tomo slice 41/82.0]
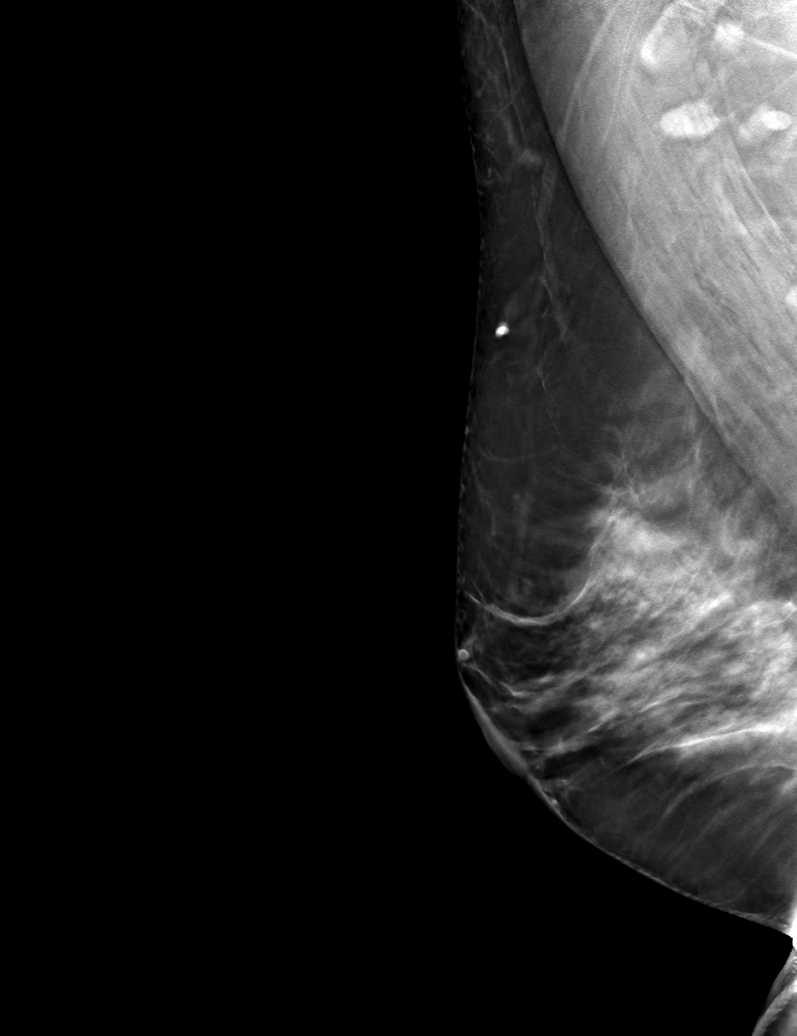

[6 of 30 positions shown; findings below may reference images not displayed]

ACR Breast Density Category d: The breast tissue is extremely dense,
which lowers the sensitivity of mammography.
FINDINGS: In the right breast, a possible mass warrants further evaluation. In
the left breast, no findings suspicious for malignancy. Images were
processed with CAD.
IMPRESSION: Further evaluation is suggested for possible mass in the right
breast.

RECOMMENDATION:
Diagnostic mammogram and possibly ultrasound of the right breast.
(Code:X8-T-11G)

The patient will be contacted regarding the findings, and additional
imaging will be scheduled.

BI-RADS CATEGORY  0: Incomplete. Need additional imaging evaluation
and/or prior mammograms for comparison.

## 2021-06-25 ENCOUNTER — Other Ambulatory Visit: Payer: 59

## 2021-06-28 ENCOUNTER — Ambulatory Visit: Payer: 59 | Admitting: Physician Assistant

## 2022-02-21 ENCOUNTER — Other Ambulatory Visit: Payer: Self-pay | Admitting: Physician Assistant

## 2022-02-21 DIAGNOSIS — Z1231 Encounter for screening mammogram for malignant neoplasm of breast: Secondary | ICD-10-CM

## 2022-02-24 ENCOUNTER — Ambulatory Visit
Admission: RE | Admit: 2022-02-24 | Discharge: 2022-02-24 | Disposition: A | Discharge status: Home / Self Care | Payer: 59 | Source: Ambulatory Visit | Attending: Physician Assistant | Admitting: Physician Assistant

## 2022-02-24 DIAGNOSIS — Z1231 Encounter for screening mammogram for malignant neoplasm of breast: Secondary | ICD-10-CM

## 2022-05-05 ENCOUNTER — Encounter: Payer: Self-pay | Admitting: Physician Assistant

## 2022-05-05 ENCOUNTER — Ambulatory Visit (INDEPENDENT_AMBULATORY_CARE_PROVIDER_SITE_OTHER): Payer: 59 | Admitting: Physician Assistant

## 2022-05-05 VITALS — BP 110/72 | HR 70 | Temp 97.7°F | Ht 69.0 in | Wt 177.0 lb

## 2022-05-05 DIAGNOSIS — E7800 Pure hypercholesterolemia, unspecified: Secondary | ICD-10-CM

## 2022-05-05 DIAGNOSIS — Z1321 Encounter for screening for nutritional disorder: Secondary | ICD-10-CM

## 2022-05-05 DIAGNOSIS — Z1329 Encounter for screening for other suspected endocrine disorder: Secondary | ICD-10-CM

## 2022-05-05 DIAGNOSIS — Z0000 Encounter for general adult medical examination without abnormal findings: Secondary | ICD-10-CM

## 2022-05-05 DIAGNOSIS — R7302 Impaired glucose tolerance (oral): Secondary | ICD-10-CM

## 2022-05-05 HISTORY — DX: Pure hypercholesterolemia, unspecified: E78.00

## 2022-05-05 NOTE — Progress Notes (Signed)
Established patient visit   Patient: Julie Garcia   DOB: 07/28/1971   51 y.o. Female  MRN: [id number] Visit Date: 05/05/2022  Chief Complaint  Patient presents with   Follow-up   Subjective    HPI  Patient presents for chronic follow-up.   Prediabetes: Pt denies increased urination or thirst. Patient has made dietary changes and reduce simple carbohydrates. 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  HLD: Pt reports started a challenge in January and got a certificate in nutrition so has changed her diet. Following more of a heart healthy diet.     Medications: Outpatient Medications Prior to Visit  Medication Sig   Aspirin (ASPIR-81 PO) Take by mouth daily.   cetirizine (ZYRTEC) 10 MG tablet Take 10 mg by mouth as needed for allergies.   No facility-administered medications prior to visit.    Review of Systems Review of Systems:  A fourteen system review of systems was performed and found to be positive as per HPI.  Last CBC Lab Results  Component Value Date   WBC 6.3 12/24/2020   HGB 14.2 12/24/2020   HCT 41.3 12/24/2020   MCV 96 12/24/2020   MCH 32.9 12/24/2020   RDW 12.9 12/24/2020   PLT 232 56/97/9480   Last metabolic panel Lab Results  Component Value Date   GLUCOSE 109 (H) 12/24/2020   NA 142 12/24/2020   K 4.6 12/24/2020   CL 102 12/24/2020   CO2 22 12/24/2020   BUN 10 12/24/2020   CREATININE 0.92 12/24/2020   GFRNONAA 74 12/24/2020   CALCIUM 9.5 12/24/2020   PHOS 3.4 05/10/2017   PROT 7.3 12/24/2020   ALBUMIN 4.6 12/24/2020   LABGLOB 2.7 12/24/2020   AGRATIO 1.7 12/24/2020   BILITOT 0.5 12/24/2020   ALKPHOS 71 12/24/2020   AST 22 12/24/2020   ALT 22 12/24/2020   Last lipids Lab Results  Component Value Date   CHOL 215 (H) 12/24/2020   HDL 102 12/24/2020   LDLCALC 102 (H) 12/24/2020   TRIG 61 12/24/2020   CHOLHDL 2.1 12/24/2020   Last hemoglobin A1c Lab Results  Component Value Date   HGBA1C 5.9 (H) 12/24/2020    Last thyroid functions Lab Results  Component Value Date   TSH 2.350 12/24/2020   Last vitamin D Lab Results  Component Value Date   VD25OH 32.2 05/10/2017     Objective    BP 110/72   Pulse 70   Temp 97.7 F (36.5 C)   Ht '5\' 9"'  (1.753 m)   Wt 177 lb (80.3 kg)   SpO2 98%   BMI 26.14 kg/m  BP Readings from Last 3 Encounters:  05/05/22 110/72  12/29/20 106/70  04/21/20 118/68   Wt Readings from Last 3 Encounters:  05/05/22 177 lb (80.3 kg)  12/29/20 182 lb 9.6 oz (82.8 kg)  04/21/20 179 lb 3.2 oz (81.3 kg)    Physical Exam  General:  Well Developed, well nourished, appropriate for stated age.  Neuro:  Alert and oriented,  extra-ocular muscles intact  HEENT:  Normocephalic, atraumatic, neck supple  Skin:  no gross rash, warm, pink. Cardiac:  RRR, S1 S2 Respiratory: CTA B/L  Vascular:  Ext warm, no cyanosis apprec.; cap RF less 2 sec. Psych:  No HI/SI, judgement and insight good, Euthymic mood. Full Affect.   No results found for any visits on 05/05/22.  Assessment & Plan      Problem List Items Addressed This Visit  Endocrine   Glucose intolerance (impaired glucose tolerance)    -Last A1c 5.9, will repeat today. Recommend to continue with dietary changes and limiting simple carbohydrates and glucose intake. Will continue to monitor.       Relevant Orders   CBC w/Diff   Comp Met (CMET)   Lipid Profile   HgB A1c     Other   Elevated LDL cholesterol level - Primary    -Last lipid panel: HDL 102,  LDL 102 -Will repeat lipid panel today. -Recommend to continue with diet and lifestyle interventions. -Will continue to monitor.       Relevant Orders   CBC w/Diff   Comp Met (CMET)   Lipid Profile   Other Visit Diagnoses     Healthcare maintenance       Relevant Orders   CBC w/Diff   Comp Met (CMET)   Lipid Profile   HgB A1c   TSH   Screening for thyroid disorder       Relevant Orders   TSH   Encounter for vitamin deficiency  screening       Relevant Orders   B12 and Folate Panel   Vitamin D (25 hydroxy)       Return in about 4 weeks (around 06/02/2022) for CPE.        Lorrene Reid, PA-C  [facility] at [facility] [phone] (phone) [phone] (fax)  [place]

## 2022-05-05 NOTE — Assessment & Plan Note (Signed)
-  Last lipid panel: HDL 102,  LDL 102 -Will repeat lipid panel today. -Recommend to continue with diet and lifestyle interventions. -Will continue to monitor.

## 2022-05-05 NOTE — Assessment & Plan Note (Signed)
-  Last A1c 5.9, will repeat today. Recommend to continue with dietary changes and limiting simple carbohydrates and glucose intake. Will continue to monitor.

## 2022-05-05 NOTE — Patient Instructions (Signed)

## 2022-05-06 LAB — LIPID PANEL
Chol/HDL Ratio: 2.1 ratio (ref 0.0–4.4)
Cholesterol, Total: 215 mg/dL — ABNORMAL HIGH (ref 100–199)
HDL: 102 mg/dL (ref >39)
LDL Chol Calc (NIH): 101 mg/dL — ABNORMAL HIGH (ref 0–99)
Triglycerides: 66 mg/dL (ref 0–149)
VLDL Cholesterol Cal: 12 mg/dL (ref 5–40)

## 2022-05-06 LAB — COMPREHENSIVE METABOLIC PANEL
ALT: 15 IU/L (ref 0–32)
AST: 15 IU/L (ref 0–40)
Albumin/Globulin Ratio: 1.7 (ref 1.2–2.2)
Albumin: 4.7 g/dL (ref 3.8–4.8)
Alkaline Phosphatase: 67 IU/L (ref 44–121)
BUN/Creatinine Ratio: 14 (ref 9–23)
BUN: 11 mg/dL (ref 6–24)
Bilirubin Total: 0.4 mg/dL (ref 0.0–1.2)
CO2: 23 mmol/L (ref 20–29)
Calcium: 9.4 mg/dL (ref 8.7–10.2)
Chloride: 101 mmol/L (ref 96–106)
Creatinine, Ser: 0.78 mg/dL (ref 0.57–1.00)
Globulin, Total: 2.8 g/dL (ref 1.5–4.5)
Glucose: 105 mg/dL — ABNORMAL HIGH (ref 70–99)
Potassium: 4.9 mmol/L (ref 3.5–5.2)
Sodium: 139 mmol/L (ref 134–144)
Total Protein: 7.5 g/dL (ref 6.0–8.5)
eGFR: 93 mL/min/{1.73_m2} (ref >59)

## 2022-05-06 LAB — CBC WITH DIFFERENTIAL/PLATELET
Basophils Absolute: 0 10*3/uL (ref 0.0–0.2)
Basos: 0 %
EOS (ABSOLUTE): 0.1 10*3/uL (ref 0.0–0.4)
Eos: 1 %
Hematocrit: 41.4 % (ref 34.0–46.6)
Hemoglobin: 14.1 g/dL (ref 11.1–15.9)
Immature Grans (Abs): 0 10*3/uL (ref 0.0–0.1)
Immature Granulocytes: 0 %
Lymphocytes Absolute: 1.7 10*3/uL (ref 0.7–3.1)
Lymphs: 25 %
MCH: 32.6 pg (ref 26.6–33.0)
MCHC: 34.1 g/dL (ref 31.5–35.7)
MCV: 96 fL (ref 79–97)
Monocytes Absolute: 0.6 10*3/uL (ref 0.1–0.9)
Monocytes: 9 %
Neutrophils Absolute: 4.3 10*3/uL (ref 1.4–7.0)
Neutrophils: 65 %
Platelets: 212 10*3/uL (ref 150–450)
RBC: 4.32 x10E6/uL (ref 3.77–5.28)
RDW: 13.4 % (ref 11.7–15.4)
WBC: 6.8 10*3/uL (ref 3.4–10.8)

## 2022-05-06 LAB — B12 AND FOLATE PANEL
Folate: 18.8 ng/mL (ref >3.0)
Vitamin B-12: 432 pg/mL (ref 232–1245)

## 2022-05-06 LAB — HEMOGLOBIN A1C
Est. average glucose Bld gHb Est-mCnc: 120 mg/dL
Hgb A1c MFr Bld: 5.8 % — ABNORMAL HIGH (ref 4.8–5.6)

## 2022-05-06 LAB — TSH: TSH: 1.81 u[IU]/mL (ref 0.450–4.500)

## 2022-05-06 LAB — VITAMIN D 25 HYDROXY (VIT D DEFICIENCY, FRACTURES): Vit D, 25-Hydroxy: 33.3 ng/mL (ref 30.0–100.0)

## 2023-02-07 ENCOUNTER — Other Ambulatory Visit: Payer: 59

## 2023-02-08 ENCOUNTER — Other Ambulatory Visit: Payer: Self-pay

## 2023-02-08 DIAGNOSIS — R7302 Impaired glucose tolerance (oral): Secondary | ICD-10-CM

## 2023-02-08 DIAGNOSIS — Z0000 Encounter for general adult medical examination without abnormal findings: Secondary | ICD-10-CM

## 2023-02-08 DIAGNOSIS — Z1329 Encounter for screening for other suspected endocrine disorder: Secondary | ICD-10-CM

## 2023-02-08 DIAGNOSIS — E7800 Pure hypercholesterolemia, unspecified: Secondary | ICD-10-CM

## 2023-02-09 ENCOUNTER — Other Ambulatory Visit: Payer: 59

## 2023-02-09 DIAGNOSIS — Z0000 Encounter for general adult medical examination without abnormal findings: Secondary | ICD-10-CM

## 2023-02-09 DIAGNOSIS — R7302 Impaired glucose tolerance (oral): Secondary | ICD-10-CM

## 2023-02-09 DIAGNOSIS — Z1329 Encounter for screening for other suspected endocrine disorder: Secondary | ICD-10-CM

## 2023-02-09 DIAGNOSIS — E7800 Pure hypercholesterolemia, unspecified: Secondary | ICD-10-CM

## 2023-02-10 LAB — COMPREHENSIVE METABOLIC PANEL
ALT: 19 IU/L (ref 0–32)
AST: 16 IU/L (ref 0–40)
Albumin/Globulin Ratio: 1.5 (ref 1.2–2.2)
Albumin: 4.3 g/dL (ref 3.9–4.9)
Alkaline Phosphatase: 65 IU/L (ref 44–121)
BUN/Creatinine Ratio: 10 (ref 9–23)
BUN: 9 mg/dL (ref 6–24)
Bilirubin Total: 0.6 mg/dL (ref 0.0–1.2)
CO2: 21 mmol/L (ref 20–29)
Calcium: 9.2 mg/dL (ref 8.7–10.2)
Chloride: 100 mmol/L (ref 96–106)
Creatinine, Ser: 0.86 mg/dL (ref 0.57–1.00)
Globulin, Total: 2.8 g/dL (ref 1.5–4.5)
Glucose: 97 mg/dL (ref 70–99)
Potassium: 4.6 mmol/L (ref 3.5–5.2)
Sodium: 139 mmol/L (ref 134–144)
Total Protein: 7.1 g/dL (ref 6.0–8.5)
eGFR: 82 mL/min/{1.73_m2} (ref >59)

## 2023-02-10 LAB — CBC WITH DIFFERENTIAL/PLATELET
Basophils Absolute: 0 10*3/uL (ref 0.0–0.2)
Basos: 0 %
EOS (ABSOLUTE): 0.1 10*3/uL (ref 0.0–0.4)
Eos: 1 %
Hematocrit: 39.2 % (ref 34.0–46.6)
Hemoglobin: 12.9 g/dL (ref 11.1–15.9)
Immature Grans (Abs): 0 10*3/uL (ref 0.0–0.1)
Immature Granulocytes: 0 %
Lymphocytes Absolute: 1.9 10*3/uL (ref 0.7–3.1)
Lymphs: 29 %
MCH: 31.5 pg (ref 26.6–33.0)
MCHC: 32.9 g/dL (ref 31.5–35.7)
MCV: 96 fL (ref 79–97)
Monocytes Absolute: 0.6 10*3/uL (ref 0.1–0.9)
Monocytes: 9 %
Neutrophils Absolute: 4.1 10*3/uL (ref 1.4–7.0)
Neutrophils: 61 %
Platelets: 237 10*3/uL (ref 150–450)
RBC: 4.09 x10E6/uL (ref 3.77–5.28)
RDW: 12.8 % (ref 11.7–15.4)
WBC: 6.8 10*3/uL (ref 3.4–10.8)

## 2023-02-10 LAB — LIPID PANEL
Chol/HDL Ratio: 2.5 ratio (ref 0.0–4.4)
Cholesterol, Total: 199 mg/dL (ref 100–199)
HDL: 81 mg/dL (ref >39)
LDL Chol Calc (NIH): 107 mg/dL — ABNORMAL HIGH (ref 0–99)
Triglycerides: 59 mg/dL (ref 0–149)
VLDL Cholesterol Cal: 11 mg/dL (ref 5–40)

## 2023-02-10 LAB — HEMOGLOBIN A1C
Est. average glucose Bld gHb Est-mCnc: 123 mg/dL
Hgb A1c MFr Bld: 5.9 % — ABNORMAL HIGH (ref 4.8–5.6)

## 2023-02-10 LAB — TSH: TSH: 2.25 u[IU]/mL (ref 0.450–4.500)

## 2023-02-14 ENCOUNTER — Ambulatory Visit (INDEPENDENT_AMBULATORY_CARE_PROVIDER_SITE_OTHER): Payer: 59 | Admitting: Family Medicine

## 2023-02-14 ENCOUNTER — Encounter: Payer: Self-pay | Admitting: Family Medicine

## 2023-02-14 VITALS — BP 128/81 | HR 76 | Resp 18 | Ht 69.0 in | Wt 174.0 lb

## 2023-02-14 DIAGNOSIS — G629 Polyneuropathy, unspecified: Secondary | ICD-10-CM | POA: Diagnosis not present

## 2023-02-14 DIAGNOSIS — R7302 Impaired glucose tolerance (oral): Secondary | ICD-10-CM | POA: Diagnosis not present

## 2023-02-14 DIAGNOSIS — Z0000 Encounter for general adult medical examination without abnormal findings: Secondary | ICD-10-CM

## 2023-02-14 DIAGNOSIS — E7800 Pure hypercholesterolemia, unspecified: Secondary | ICD-10-CM | POA: Diagnosis not present

## 2023-02-14 NOTE — Progress Notes (Signed)
Complete physical exam  Patient: Julie Garcia   DOB: October 19, 1971   52 y.o. Female  MRN: [id number]  Subjective:    Chief Complaint  Patient presents with   Annual Exam    Julie Garcia is a 52 y.o. female who presents today for a complete physical exam. She reports consuming a low fat, low sodium, and low carb  diet. Gym/ health club routine includes a mix of weightlifting and cardio, she works as a Physiological scientist. She generally feels fairly well. She reports sleeping well. She does have additional problems to discuss today.  For several months she has occasional bulging with some pain on her left side that she believes could be a hernia.  She has a history of cesarean section as well as abdominal hysterectomy.  It is reproducible and only occurs every few months.  She has also become increasingly frustrated with the lifestyle changes that she has made in terms of diet and exercise without seeing the results reflected in her labs.  Her A1c has been in the prediabetes category and her LDL has been greater than 100 for 5 or 6 years now, and it is frustrating that those have not decreased as she has adopted a healthier lifestyle.  She believes that she could be going through menopause or starting to go through perimenopause and hormonal changes may be contributing to some of these issues.  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  She is not currently taking any medication for it, but she has done some PT which she found helpful.  She would like to make sure that she is doing everything that she can to keep it from progressing further.  Most recent fall risk assessment:    02/14/2023    3:04 PM  [place] in the past year? 0  Number falls in past yr: 0  Injury with Fall? 0  Risk for fall due to : No Fall Risks     Most recent depression screenings:    02/14/2023    3:05 PM 05/05/2022    8:39 AM  PHQ 2/9 Scores  PHQ - 2 Score  0 0  PHQ- 9 Score  0    Patient Active Problem List   Diagnosis Date Noted   Neuropathy 02/14/2023   Elevated LDL cholesterol level 05/05/2022   Glucose intolerance (impaired glucose tolerance) 05/18/2017   S/P laparoscopic supracervical hysterectomy- 2010 for fibroid tumors 05/02/2017   Environmental and seasonal allergies 05/02/2017   Overweight (BMI 25.0-29.9) 05/02/2017   Plantar fascia syndrome 05/02/2017    Past Surgical History:  Procedure Laterality Date   CESAREAN SECTION     PARTIAL HYSTERECTOMY     TONSILLECTOMY     Social History   Tobacco Use   Smoking status: Never   Smokeless tobacco: Never  Vaping Use   Vaping Use: Never used  Substance Use Topics   Alcohol use: Yes   Drug use: No   Family History  Problem Relation Age of Onset   Pulmonary embolism Father    Hyperlipidemia Father    Pulmonary embolism Brother    Depression Brother    Pancreatic cancer Paternal Grandmother    Endometriosis Mother    Gallbladder disease Mother    Breast cancer Neg Hx    No Known Allergies   Patient Care Team: Velva Harman, PA as PCP - General (Family Medicine)   Outpatient Medications Prior to  Visit  Medication Sig   Aspirin (ASPIR-81 PO) Take by mouth daily.   cetirizine (ZYRTEC) 10 MG tablet Take 10 mg by mouth as needed for allergies.   No facility-administered medications prior to visit.    Review of Systems  Constitutional:  Negative for chills, fever and malaise/fatigue.  HENT:  Negative for congestion, ear pain and hearing loss.   Eyes:  Negative for blurred vision and double vision.       Wears contacts, goes to eye doctor annually  Respiratory:  Negative for cough and shortness of breath.   Cardiovascular:  Negative for chest pain, palpitations and leg swelling.  Gastrointestinal:  Negative for abdominal pain, constipation, diarrhea and heartburn.  Genitourinary:  Negative for frequency and urgency.  Musculoskeletal:  Negative for myalgias  and neck pain.  Neurological:  Negative for headaches.  Endo/Heme/Allergies:  Negative for polydipsia.  Psychiatric/Behavioral:  Negative for depression. The patient is not nervous/anxious.        Objective:    BP 128/81 (BP Location: Left Arm, Patient Position: Sitting, Cuff Size: Normal)   Pulse 76   Resp 18   Ht '5\' 9"'$  (1.753 m)   Wt 174 lb (78.9 kg)   SpO2 98%   BMI 25.70 kg/m    Physical Exam Constitutional:      General: She is not in acute distress.    Appearance: Normal appearance.  HENT:     Head: Normocephalic and atraumatic.     Right Ear: Tympanic membrane and ear canal normal.     Left Ear: Tympanic membrane and ear canal normal.     Nose: Nose normal.     Mouth/Throat:     Mouth: Mucous membranes are moist.     Pharynx: No oropharyngeal exudate or posterior oropharyngeal erythema.  Eyes:     Extraocular Movements: Extraocular movements intact.     Pupils: Pupils are equal, round, and reactive to light.  Cardiovascular:     Rate and Rhythm: Normal rate and regular rhythm.     Heart sounds: Normal heart sounds.  Pulmonary:     Effort: Pulmonary effort is normal.     Breath sounds: Normal breath sounds.  Abdominal:     General: Abdomen is flat. Bowel sounds are normal.     Palpations: Abdomen is soft.     Tenderness: There is no abdominal tenderness. There is no guarding.     Hernia: A hernia (Left, inguinal or femoral.  Very small defect.) is present.  Musculoskeletal:        General: Normal range of motion.     Cervical back: Normal range of motion and neck supple.  Skin:    General: Skin is warm and dry.  Neurological:     Mental Status: She is alert and oriented to person, place, and time.  Psychiatric:        Mood and Affect: Mood normal.       Assessment & Plan:    Routine Health Maintenance and Physical Exam  Immunization History  Administered Date(s) Administered   Influenza,inj,Quad PF,6+ Mos 10/09/2017   Influenza-Unspecified  09/18/2020   PFIZER Comirnaty(Gray Top)Covid-19 Tri-Sucrose Vaccine 09/18/2020   PFIZER(Purple Top)SARS-COV-2 Vaccination 01/30/2020, 02/25/2020   PPD Test 04/21/2020   Tdap 07/04/2017    Health Maintenance  Topic Date Due   COLONOSCOPY (Pts 45-65yr Insurance coverage will need to be confirmed)  Never done   COVID-19 Vaccine (4 - 2023-24 season) 03/02/2023 (Originally 08/05/2022)   INFLUENZA VACCINE  03/05/2023 (Originally 07/05/2022)  Zoster Vaccines- Shingrix (1 of 2) 05/17/2023 (Originally 08/29/2022)   Hepatitis C Screening  02/14/2024 (Originally 08/29/1990)   HIV Screening  05/18/2029 (Originally 08/30/1987)   PAP SMEAR-Modifier  12/30/2023   MAMMOGRAM  02/25/2024   DTaP/Tdap/Td (2 - Td or Tdap) 07/05/2027   HPV VACCINES  Aged Out   Patient is going to schedule mammogram and colonoscopy for next month.  She will be due for her next Pap smear next year.  Discussed health benefits of physical activity, and encouraged her to continue to engage in regular exercise appropriate for her age and condition.  Wellness examination  Glucose intolerance (impaired glucose tolerance) Assessment & Plan: Last A1c 5.8, most recent stable at 5.9.  Recommend to continue with dietary changes and exercise routine.  We also discussed the frustration with not seeing the numbers move even with all of these changes.  We may consider referral to endocrinology for further evaluation in the future.  We also discussed that there can be other contributors to conditions like prediabetes.  If patient ever starts experiencing any menopausal symptoms in the future, we may consider testing hormonal levels as she is status post total hysterectomy and does not have periods to indicate whether or not she is in menopause.  Fluctuating hormone levels that come with menopause can contribute to insulin resistance.  Patient verbalized understanding and is agreeable to this plan.   Elevated LDL cholesterol level Assessment &  Plan: Last lipid panel: HDL 81, LDL 107.  We discussed that her HDL level is still very high which is great.  LDL has been pretty stable the last several times that it has been checked.  Encouraged her to continue with her diet and exercise routine.  We will continue to monitor this.  We did discuss additionally that with conditions like hyperlipidemia as well as prediabetes, there can be a genetic component.   Neuropathy Assessment & Plan: Patient is agreeable to a referral to neurology for further evaluation.  She has never had any nerve conduction studies done which may be a good for step to evaluate her neurology.  Orders: -     Ambulatory referral to Neurology    Return in about 3 months (around 05/17/2023) for follow-up, recheck labs.     Velva Harman, PA

## 2023-02-14 NOTE — Patient Instructions (Signed)
Anti Aging Unraveled with Jasmine December DO  It was nice to meet you today!

## 2023-02-14 NOTE — Assessment & Plan Note (Addendum)
Last lipid panel: HDL 81, LDL 107.  We discussed that her HDL level is still very high which is great.  LDL has been pretty stable the last several times that it has been checked.  Encouraged her to continue with her diet and exercise routine.  We will continue to monitor this.  We did discuss additionally that with conditions like hyperlipidemia as well as prediabetes, there can be a genetic component.

## 2023-02-14 NOTE — Assessment & Plan Note (Signed)
Patient is agreeable to a referral to neurology for further evaluation.  She has never had any nerve conduction studies done which may be a good for step to evaluate her neurology.

## 2023-02-14 NOTE — Assessment & Plan Note (Signed)
Last A1c 5.8, most recent stable at 5.9.  Recommend to continue with dietary changes and exercise routine.  We also discussed the frustration with not seeing the numbers move even with all of these changes.  We may consider referral to endocrinology for further evaluation in the future.  We also discussed that there can be other contributors to conditions like prediabetes.  If patient ever starts experiencing any menopausal symptoms in the future, we may consider testing hormonal levels as she is status post total hysterectomy and does not have periods to indicate whether or not she is in menopause.  Fluctuating hormone levels that come with menopause can contribute to insulin resistance.  Patient verbalized understanding and is agreeable to this plan.

## 2023-02-15 ENCOUNTER — Encounter: Payer: Self-pay | Admitting: Neurology

## 2023-03-06 NOTE — Progress Notes (Unsigned)
Initial neurology clinic note  SERVICE DATE: 03/09/23  Reason for Evaluation: Consultation requested by Velva Harman, PA for an opinion regarding neuropathy in feet. My final recommendations will be communicated back to the requesting physician by way of shared medical record or letter to requesting physician via [place] mail.  HPI: This is [name], a 52 y.o. right-handed female with a medical history of pre-diabetes who presents to neurology clinic with the chief complaint of pain and numbness in her feet. The patient is accompanied by alone.  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 She first noticed symptoms around 10/2020. She had achiness and burning of both feet. She was told she had neuropathy by her PCP. She had a shot in her foot at one point that did not help. Symptoms are worse at night. Her symptoms have not progressed past her feet.  She currently has burning/hotness in feet without change in color of her feet. She is very sensitive in her feet, such that her husband cannot touch her feet. She feels like a sock is bunched up under her feet. She has numbness in her toes as well. She has to wear socks with ice packs at night. She has noticed that over time she does not notice the symptoms as much (getting used to the symptoms vs getting better). She has noticed that touching her chin to her chest causes electric shocks in her feet. There is no electric sensation in her neck. She does endorse low back pain but without sciatica like pain. She denies imbalance and falls.  She has seen PT in the past and had dry needling. This did not help.  Patient wants to make sure this is neuropathy, that it does not get worse.  She does not report any constitutional symptoms like fever, night sweats, anorexia or unintentional weight loss.  EtOH use: couple of glasses of wine during the week, and 3-4 glasses of beer on weekend  Restrictive diet? No Family history of  neuropathy/myopathy/NM disease? No  Patient has never had an EMG. She has never been on medications for her symptoms and does not want to.  She also mentions "shaking of hands". Father also has the symptoms. She does not think EtOH changes the shakiness.   MEDICATIONS:  Outpatient Encounter Medications as of 03/09/2023  Medication Sig   cetirizine (ZYRTEC) 10 MG tablet Take 10 mg by mouth as needed for allergies.   [DISCONTINUED] Aspirin (ASPIR-81 PO) Take by mouth daily. (Patient not taking: Reported on 03/09/2023)   No facility-administered encounter medications on file as of 03/09/2023.    PAST MEDICAL HISTORY: History reviewed. No pertinent past medical history.  PAST SURGICAL HISTORY: Past Surgical History:  Procedure Laterality Date   CESAREAN SECTION     PARTIAL HYSTERECTOMY     TONSILLECTOMY      ALLERGIES: No Known Allergies  FAMILY HISTORY: Family History  Problem Relation Age of Onset   Pulmonary embolism Father    Hyperlipidemia Father    Pulmonary embolism Brother    Depression Brother    Pancreatic cancer Paternal Grandmother    Endometriosis Mother    Gallbladder disease Mother    Breast cancer Neg Hx     SOCIAL HISTORY: Social History   Tobacco Use   Smoking status: Never   Smokeless tobacco: Never  Vaping Use   Vaping Use: Never used  Substance Use Topics   Alcohol use: Yes    Comment: 2 glasses  of wine or beer a nigh   Drug use: No   Social History   Social History Narrative   Are you right handed or left handed? right   Are you currently employed ? 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 Husband and child   What type of home do you live in: 1 story or 2 story? two    Caffeine 1 cup in am     OBJECTIVE: PHYSICAL EXAM: BP 123/84   Pulse 82   Ht 5\' 9"  (1.753 m)   Wt 180 lb (81.6 kg)   SpO2 99%   BMI 26.58 kg/m   General: General appearance: Awake and alert.  No distress. Cooperative with exam.  Skin: No obvious rash or jaundice. HEENT: Atraumatic. Anicteric. Lungs: Non-labored breathing on room air  Extremities: No edema. No obvious deformity.  Musculoskeletal: No obvious joint swelling. Psych: Affect appropriate.  Neurological: Mental Status: Alert. Speech fluent. No pseudobulbar affect Cranial Nerves: CNII: No RAPD. Visual fields grossly intact. CNIII, IV, VI: PERRL. No nystagmus. EOMI. CN V: Facial sensation intact bilaterally to fine touch. CN VII: Facial muscles symmetric and strong. No ptosis at rest. CN VIII: Hearing grossly intact bilaterally. CN IX: No hypophonia. CN X: Palate elevates symmetrically. CN XI: Full strength shoulder shrug bilaterally. CN XII: Tongue protrusion full and midline. No atrophy or fasciculations. No significant dysarthria Motor: Tone is normal. Strength is 5/5 in bilateral upper and lower extremities. Reflexes:  Right Left   Bicep 2+ 2+   Tricep 2+ 2+   BrRad 2+ 2+   Knee 2+ 2+   Ankle 2+ 2+ 1-2 beats of clonus on right ankle   Pathological Reflexes: Babinski: mute response bilaterally Hoffman: absent bilaterally Troemner: absent bilaterally Sensation: Pinprick: Intact in upper extremities. Diminished to 10% of normal in bilateral feet and diminished to mid calf bilaterally Vibration: Intact in all extremities (~ 15 seconds in bilateral great toes) Proprioception: Intact in bilateral great toes Coordination: Intact finger-to- nose-finger bilaterally. Intention tremor in bilateral upper extremities (L > R). Romberg negative. Gait: Able to rise from chair with arms crossed unassisted. Normal, narrow-based gait. Able to tandem walk. Able to walk on toes and heels.  Lab and Test Review: Internal labs: 02/09/23: TSH wnl HbA1c: 5.9 CMP wnl CBC wnl  05/05/22: Vit D wnl Folate wnl B12: 432  ASSESSMENT: Julie Garcia is a 52 y.o. female who presents for evaluation of numbness, tingling, and  burning in feet. She has a relevant medical history of pre-diabetes. Her neurological examination is pertinent for diminished sensation in bilateral lower extremities to pin prick in a distal to proximal gradient. Available diagnostic data is significant for HbA1c of 5.9, B12 of 43, and normal folate. 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 Her known risk factors include pre-diabetes. She does drink EtOH, but in moderation. I will send labs to look for other treatable causes and get an EMG to clarify.  She also mentions a tremor in her hands, worse on the left. This is consistent with essential tremor. She is not currently interested in treatment for neuropathy or tremor.  PLAN: -Blood work: B1, B6, IFE -EMG: PN (R > L) -Will consider skin biopsy if negative -Lidocaine cream PRN -Alpha lipoic acid 600 mg once or twice daily  -Return to clinic in 3 months  The impression above as well as  the plan as outlined below were extensively discussed with the patient who voiced understanding. All questions were answered to their satisfaction.  When available, results of the above investigations and possible further recommendations will be communicated to the patient via telephone/MyChart. Patient to call office if not contacted after expected testing turnaround time.   Total time spent reviewing records, interview, history/exam, documentation, and coordination of care on day of encounter:  50 min   Thank you for allowing me to participate in patient's care.  If I can answer any additional questions, I would be pleased to do so.  Kai Levins, MD   CC: Velva Harman, [place] [id number]  CC: Referring provider: Velva Harman, [address] [place],  [place] [address]

## 2023-03-07 ENCOUNTER — Telehealth: Payer: Self-pay

## 2023-03-07 DIAGNOSIS — Z1211 Encounter for screening for malignant neoplasm of colon: Secondary | ICD-10-CM

## 2023-03-07 NOTE — Telephone Encounter (Signed)
Pt is requesting a referral for a colonoscopy. 

## 2023-03-07 NOTE — Telephone Encounter (Signed)
Referral has been placed. 

## 2023-03-09 ENCOUNTER — Encounter: Payer: Self-pay | Admitting: Neurology

## 2023-03-09 ENCOUNTER — Other Ambulatory Visit (INDEPENDENT_AMBULATORY_CARE_PROVIDER_SITE_OTHER): Payer: 59

## 2023-03-09 ENCOUNTER — Ambulatory Visit: Payer: 59 | Admitting: Neurology

## 2023-03-09 ENCOUNTER — Other Ambulatory Visit: Payer: Self-pay | Admitting: Family Medicine

## 2023-03-09 VITALS — BP 123/84 | HR 82 | Ht 69.0 in | Wt 180.0 lb

## 2023-03-09 DIAGNOSIS — R209 Unspecified disturbances of skin sensation: Secondary | ICD-10-CM

## 2023-03-09 DIAGNOSIS — G629 Polyneuropathy, unspecified: Secondary | ICD-10-CM

## 2023-03-09 DIAGNOSIS — R7303 Prediabetes: Secondary | ICD-10-CM | POA: Diagnosis not present

## 2023-03-09 DIAGNOSIS — G250 Essential tremor: Secondary | ICD-10-CM | POA: Diagnosis not present

## 2023-03-09 DIAGNOSIS — Z1231 Encounter for screening mammogram for malignant neoplasm of breast: Secondary | ICD-10-CM

## 2023-03-09 NOTE — Patient Instructions (Addendum)
Your symptoms sound consistent with neuropathy.  I would like to investigate further with following: -Blood work today -EMG (muscle and nerve test) - see more information below  I will be in touch when I have your results to discuss next steps.  Alpha lipoic acid 600mg  daily has some research data suggesting it helps with nerve health. No major side effects other than <1% of people report upset stomach. This can be taken twice per day (1200mg  daily) if no relief obtained. You can buy this over the counter or online.  You can also try Lidocaine cream as needed. Apply wear you have pain, tingling, or burning. Wear gloves to prevent your hands being numb. This can be bought over the counter at any drug store or online.  If your tremor becomes bothersome, let me know, as we have treatments for this. It is most likely a benign tremor called essential tremor.  I would like to see you back in clinic in 3 months. Please let me know if you have any questions or concerns in the meantime.  The physicians and staff at [facility] are committed to providing excellent care. You may receive a survey requesting feedback about your experience at our office. We strive to receive "very good" responses to the survey questions. If you feel that your experience would prevent you from giving the office a "very good " response, please contact our office to try to remedy the situation. We may be reached at [phone]. Thank you for taking the time out of your busy day to complete the survey.  Kai Levins, MD [facility]  ELECTROMYOGRAM AND NERVE CONDUCTION STUDIES (EMG/NCS) INSTRUCTIONS  How to Prepare The neurologist conducting the EMG will need to know if you have certain medical conditions. Tell the neurologist and other EMG lab personnel if you: Have a pacemaker or any other electrical medical device Take blood-thinning medications Have hemophilia, a blood-clotting disorder that causes prolonged  bleeding Bathing Take a shower or bath shortly before your exam in order to remove oils from your skin. Don't apply lotions or creams before the exam.  What to Expect You'll likely be asked to change into a hospital gown for the procedure and lie down on an examination table. The following explanations can help you understand what will happen during the exam.  Electrodes. The neurologist or a technician places surface electrodes at various locations on your skin depending on where you're experiencing symptoms. Or the neurologist may insert needle electrodes at different sites depending on your symptoms.  Sensations. The electrodes will at times transmit a tiny electrical current that you may feel as a twinge or spasm. The needle electrode may cause discomfort or pain that usually ends shortly after the needle is removed. If you are concerned about discomfort or pain, you may want to talk to the neurologist about taking a short break during the exam.  Instructions. During the needle EMG, the neurologist will assess whether there is any spontaneous electrical activity when the muscle is at rest - activity that isn't present in healthy muscle tissue - and the degree of activity when you slightly contract the muscle.  He or she will give you instructions on resting and contracting a muscle at appropriate times. Depending on what muscles and nerves the neurologist is examining, he or she may ask you to change positions during the exam.  After your EMG You may experience some temporary, minor bruising where the needle electrode was inserted into your muscle. This  bruising should fade within several days. If it persists, contact your primary care doctor.

## 2023-03-14 LAB — IMMUNOFIXATION ELECTROPHORESIS
IgG (Immunoglobin G), Serum: 1112 mg/dL (ref 600–1640)
IgM, Serum: 99 mg/dL (ref 50–300)
Immunoglobulin A: 321 mg/dL — ABNORMAL HIGH (ref 47–310)

## 2023-03-14 LAB — VITAMIN B1: Vitamin B1 (Thiamine): 6 nmol/L — ABNORMAL LOW (ref 8–30)

## 2023-03-14 LAB — VITAMIN B6: Vitamin B6: 17 ng/mL (ref 2.1–21.7)

## 2023-03-16 ENCOUNTER — Ambulatory Visit: Payer: 59 | Admitting: *Deleted

## 2023-03-16 VITALS — Ht 69.0 in | Wt 180.0 lb

## 2023-03-16 DIAGNOSIS — Z1211 Encounter for screening for malignant neoplasm of colon: Secondary | ICD-10-CM

## 2023-03-16 MED ORDER — NA SULFATE-K SULFATE-MG SULF 17.5-3.13-1.6 GM/177ML PO SOLN: 1.0000 | Freq: Once | ORAL | 0 refills | Status: AC

## 2023-03-16 NOTE — Progress Notes (Signed)
Pt's pre-visit is done over the phone and all paperwork (prep instructions) sent to patient. Pt's name and DOB verified at the beginning of the pre-visit. Pt denies any difficulty with ambulating.  No egg or soy allergy known to patient  No issues known to pt with past sedation with any surgeries or procedures  PONV HX Patient denies ever being intubated Pt has no issues moving head neck or swallowing No FH of Malignant Hyperthermia Pt is not on diet pills Pt is not on home 02  Pt is not on blood thinners  Pt denies issues with constipation  Pt has frequent issues with constipation RN instructed pt to use Miralax per bottles instructions a week before prep days. Pt states they will Pt is not on dialysis Pt denies any upcoming cardiac testing Pt encouraged to use to use Singlecare or Goodrx to reduce cost  Patient's chart reviewed by Cathlyn Parsons CNRA prior to pre-visit and patient appropriate for the [facility].  Pre-visit completed and red dot placed by patient's name on their procedure day (on provider's schedule).  . Visit by phone Pt states  weight is 180lb Instructions reviewed with pt and pt states understanding. Instructed to review again prior to procedure. Pt states they will.  Instructions sent by mail with coupon and by my chart

## 2023-03-22 ENCOUNTER — Ambulatory Visit
Admission: RE | Admit: 2023-03-22 | Discharge: 2023-03-22 | Disposition: A | Discharge status: Home / Self Care | Payer: 59 | Source: Ambulatory Visit

## 2023-03-22 DIAGNOSIS — Z1231 Encounter for screening mammogram for malignant neoplasm of breast: Secondary | ICD-10-CM

## 2023-03-24 ENCOUNTER — Encounter: Payer: Self-pay | Admitting: Gastroenterology

## 2023-03-24 ENCOUNTER — Ambulatory Visit (AMBULATORY_SURGERY_CENTER): Payer: 59 | Admitting: Gastroenterology

## 2023-03-24 VITALS — BP 129/72 | HR 62 | Temp 97.8°F | Resp 19 | Ht 69.0 in | Wt 180.0 lb

## 2023-03-24 DIAGNOSIS — D125 Benign neoplasm of sigmoid colon: Secondary | ICD-10-CM | POA: Diagnosis not present

## 2023-03-24 DIAGNOSIS — Z1211 Encounter for screening for malignant neoplasm of colon: Secondary | ICD-10-CM

## 2023-03-24 DIAGNOSIS — K635 Polyp of colon: Secondary | ICD-10-CM

## 2023-03-24 DIAGNOSIS — D122 Benign neoplasm of ascending colon: Secondary | ICD-10-CM

## 2023-03-24 DIAGNOSIS — K640 First degree hemorrhoids: Secondary | ICD-10-CM

## 2023-03-24 MED ORDER — SODIUM CHLORIDE 0.9 % IV SOLN
500.0000 mL | Freq: Once | INTRAVENOUS | Status: DC
Start: 2023-03-24 — End: 2023-03-24

## 2023-03-24 NOTE — Progress Notes (Signed)
VS by DT  Pt's states no medical or surgical changes since previsit or office visit.  

## 2023-03-24 NOTE — Op Note (Signed)
[facility] Patient Name: Julie Garcia Procedure Date: 03/24/2023 9:17 AM MRN: [id number] Endoscopist: Doristine Locks , MD, [id number] Age: 52 Referring MD:  Date of Birth: 1971/04/22 Gender: Female Account #: [id number] Procedure:                Colonoscopy Indications:              Screening for colorectal malignant neoplasm, This                            is the patient's first colonoscopy Medicines:                Monitored Anesthesia Care Procedure:                Pre-Anesthesia Assessment:                           - Prior to the procedure, a History and Physical                            was performed, and patient medications and                            allergies were reviewed. The patient's tolerance of                            previous anesthesia was also reviewed. The risks                            and benefits of the procedure and the sedation                            options and risks were discussed with the patient.                            All questions were answered, and informed consent                            was obtained. Prior Anticoagulants: The patient has                            taken no anticoagulant or antiplatelet agents. ASA                            Grade Assessment: I - A normal, healthy patient.                            After reviewing the risks and benefits, the patient                            was deemed in satisfactory condition to undergo the                            procedure.  After obtaining informed consent, the colonoscope                            was passed under direct vision. Throughout the                            procedure, the patient's blood pressure, pulse, and                            oxygen saturations were monitored continuously. The                            [id number]-HQ190L Colonoscope [id number] was introduced                            through the anus and advanced to the  the terminal                            ileum. The colonoscopy was performed without                            difficulty. The patient tolerated the procedure                            well. The quality of the bowel preparation was                            good. The terminal ileum, ileocecal valve,                            appendiceal orifice, and rectum were photographed. Scope In: 9:24:00 AM Scope Out: 9:43:12 AM Scope Withdrawal Time: 0 hours 15 minutes 39 seconds  Total Procedure Duration: 0 hours 19 minutes 12 seconds  Findings:                 The perianal and digital rectal examinations were                            normal.                           An 8 mm polyp was found in the ascending colon. The                            polyp was mucous-capped and sessile. The polyp was                            removed with a cold snare. Resection and retrieval                            were complete. Estimated blood loss was minimal.                           A 5 mm polyp was found in the sigmoid colon.  The                            polyp was sessile. The polyp was removed with a                            cold snare. Resection and retrieval were complete.                            Estimated blood loss was minimal.                           There was an area of apparent extraluminal                            compression in the cecum and proximal ascending                            colon. The overlying mucosa was normal appearing.                            The mucosa throughout the remainder of the colon                            was otherwise normal appearing as well. No areas of                            mucosal erythema, edema, erosions, or ulceration.                           Non-bleeding internal hemorrhoids were found during                            retroflexion. The hemorrhoids were small.                           The terminal ileum appeared  normal. Complications:            No immediate complications. Estimated Blood Loss:     Estimated blood loss was minimal. Impression:               - One 8 mm polyp in the ascending colon, removed                            with a cold snare. Resected and retrieved.                           - One 5 mm polyp in the sigmoid colon, removed with                            a cold snare. Resected and retrieved.                           - There was an area of apparent extraluminal  compression in the cecum and proximal ascending                            colon. The overlying mucosa was normal appearing.                           - Normal mucosa in the remainder of the colon.                           - Non-bleeding internal hemorrhoids.                           - The examined portion of the ileum was normal. Recommendation:           - Patient has a contact number available for                            emergencies. The signs and symptoms of potential                            delayed complications were discussed with the                            patient. Return to normal activities tomorrow.                            Written discharge instructions were provided to the                            patient.                           - Resume previous diet.                           - Continue present medications.                           - Await pathology results.                           - Repeat colonoscopy for surveillance based on                            pathology results.                           - Perform a CT scan (computed tomography) of                            abdomen and pelvis with contrast at appointment to                            be scheduled to evaluate the area of apparent  extraluminal compression in the right colon.                           - Return to GI clinic PRN. Doristine Locks, MD 03/24/2023 9:52:01  AM

## 2023-03-24 NOTE — Progress Notes (Signed)
Called to room to assist during endoscopic procedure.  Patient ID and intended procedure confirmed with present staff. Received instructions for my participation in the procedure from the performing physician.  

## 2023-03-24 NOTE — Progress Notes (Signed)
Uneventful anesthetic. Report to pacu rn. Vss. Care resumed by rn. 

## 2023-03-24 NOTE — Patient Instructions (Signed)
Please read handouts provided. Continue present medications. Await pathology results. Return to GI clinic as needed. CT scan of abdomen and pelvis to be scheduled.   YOU HAD AN ENDOSCOPIC PROCEDURE TODAY AT THE [facility]:   Refer to the procedure report that was given to you for any specific questions about what was found during the examination.  If the procedure report does not answer your questions, please call your gastroenterologist to clarify.  If you requested that your care partner not be given the details of your procedure findings, then the procedure report has been included in a sealed envelope for you to review at your convenience later.  YOU SHOULD EXPECT: Some feelings of bloating in the abdomen. Passage of more gas than usual.  Walking can help get rid of the air that was put into your GI tract during the procedure and reduce the bloating. If you had a lower endoscopy (such as a colonoscopy or flexible sigmoidoscopy) you may notice spotting of blood in your stool or on the toilet paper. If you underwent a bowel prep for your procedure, you may not have a normal bowel movement for a few days.  Please Note:  You might notice some irritation and congestion in your nose or some drainage.  This is from the oxygen used during your procedure.  There is no need for concern and it should clear up in a day or so.  SYMPTOMS TO REPORT IMMEDIATELY:  Following lower endoscopy (colonoscopy or flexible sigmoidoscopy):  Excessive amounts of blood in the stool  Significant tenderness or worsening of abdominal pains  Swelling of the abdomen that is new, acute  Fever of 100F or higher   For urgent or emergent issues, a gastroenterologist can be reached at any hour by calling [phone]. Do not use MyChart messaging for urgent concerns.    DIET:  We do recommend a small meal at first, but then you may proceed to your regular diet.  Drink plenty of fluids but you should avoid  alcoholic beverages for 24 hours.  ACTIVITY:  You should plan to take it easy for the rest of today and you should NOT DRIVE or use heavy machinery until tomorrow (because of the sedation medicines used during the test).    FOLLOW UP: Our staff will call the number listed on your records the next business day following your procedure.  We will call around 7:15- 8:00 am to check on you and address any questions or concerns that you may have regarding the information given to you following your procedure. If we do not reach you, we will leave a message.     If any biopsies were taken you will be contacted by phone or by letter within the next 1-3 weeks.  Please call us at [phone] if you have not heard about the biopsies in 3 weeks.    SIGNATURES/CONFIDENTIALITY: You and/or your care partner have signed paperwork which will be entered into your electronic medical record.  These signatures attest to the fact that that the information above on your After Visit Summary has been reviewed and is understood.  Full responsibility of the confidentiality of this discharge information lies with you and/or your care-partner.

## 2023-03-24 NOTE — Progress Notes (Signed)
GASTROENTEROLOGY PROCEDURE H&P NOTE   Primary Care Physician: Melida Quitter, PA    Reason for Procedure:  Colon Cancer screening  Plan:    Colonoscopy  Patient is appropriate for endoscopic procedure(s) in the ambulatory ([facility]) setting.  The nature of the procedure, as well as the risks, benefits, and alternatives were carefully and thoroughly reviewed with the patient. Ample time for discussion and questions allowed. The patient understood, was satisfied, and agreed to proceed.     HPI: Julie Garcia is a 52 y.o. female who presents for colonoscopy for routine Colon Cancer screening.  No active GI symptoms.  Fhx notbale for father with colon polyps. No known family history of colon cancer or related malignancy.  Patient is otherwise without complaints or active issues today.  Past Medical History:  Diagnosis Date   Neuropathy     Past Surgical History:  Procedure Laterality Date   CESAREAN SECTION     PARTIAL HYSTERECTOMY     TONSILLECTOMY      Prior to Admission medications   Medication Sig Start Date End Date Taking? Authorizing Provider  cetirizine (ZYRTEC) 10 MG tablet Take 10 mg by mouth as needed for allergies.    [provider]    Current Outpatient Medications  Medication Sig Dispense Refill   cetirizine (ZYRTEC) 10 MG tablet Take 10 mg by mouth as needed for allergies.     Current Facility-Administered Medications  Medication Dose Route Frequency Provider Last Rate Last Admin   0.9 %  sodium chloride infusion  500 mL Intravenous Once Cecille Mcclusky V, DO        Allergies as of 03/24/2023   (No Known Allergies)    Family History  Problem Relation Age of Onset   Endometriosis Mother    Gallbladder disease Mother    Colon polyps Father    Pulmonary embolism Father    Hyperlipidemia Father    Pulmonary embolism Brother    Depression Brother    Pancreatic cancer Paternal Grandmother    Breast cancer Neg Hx    Colon cancer Neg Hx     Esophageal cancer Neg Hx    Rectal cancer Neg Hx    Stomach cancer Neg Hx     Social History   Socioeconomic History   Marital status: Married    Spouse name: Not on file   Number of children: 3   Years of education: Not on file   Highest education level: Not on file  Occupational History   Not on file  Tobacco Use   Smoking status: Never   Smokeless tobacco: Never  Vaping Use   Vaping Use: Never used  Substance and Sexual Activity   Alcohol use: Yes    Comment: 2 glasses of wine or beer a nigh   Drug use: No   Sexual activity: Yes    Partners: Male    Comment: Married-David  Other Topics Concern   Not on file  Social History Narrative   Are you right handed or left handed? right   Are you currently employed ? 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 Husband and child   What type of home do you live in: 1 story or 2 story? two    Caffeine 1 cup in am   Social Determinants of Health   Financial Resource Strain: Not on file  Food Insecurity: Not on file  Transportation Needs: Not on file  Physical Activity: Not on file  Stress: Not on file  Social Connections: Not on file  Intimate Partner Violence: Not on file    Physical Exam: Vital signs in last 24 hours:  116/73   Pulse 75   Temp 97.8 F (36.6 C) (Skin)   Ht  (1.753 m)   Wt 180 lb (81.6 kg)   SpO2 100%   BMI 26.58 kg/m  GEN: NAD EYE: Sclerae anicteric ENT: MMM CV: Non-tachycardic Pulm: CTA b/l GI: Soft, NT/ND NEURO:  Alert & Oriented x 3   Doristine Locks, DO [facility]   03/24/2023 9:13 AM

## 2023-03-27 ENCOUNTER — Telehealth: Payer: Self-pay | Admitting: *Deleted

## 2023-03-27 NOTE — Telephone Encounter (Signed)
  Follow up Call-    Row Labels 03/24/2023    8:42 AM  Call back number   Section Header. No data exists in this row.   Post procedure Call Back phone  #   [phone]  Permission to leave phone message   Yes     Patient questions:  Do you have a fever, pain , or abdominal swelling? No. Pain Score  0 *  Have you tolerated food without any problems? Yes.    Have you been able to return to your normal activities? Yes.    Do you have any questions about your discharge instructions: Diet   No. Medications  No. Follow up visit  No.  Do you have questions or concerns about your Care? No.  Actions: * If pain score is 4 or above: No action needed, pain <4.

## 2023-03-28 ENCOUNTER — Other Ambulatory Visit: Payer: Self-pay

## 2023-03-28 ENCOUNTER — Encounter: Payer: Self-pay | Admitting: Gastroenterology

## 2023-03-28 DIAGNOSIS — R933 Abnormal findings on diagnostic imaging of other parts of digestive tract: Secondary | ICD-10-CM

## 2023-04-11 ENCOUNTER — Encounter: Payer: 59 | Admitting: Neurology

## 2023-04-18 ENCOUNTER — Ambulatory Visit (HOSPITAL_COMMUNITY)
Admission: RE | Admit: 2023-04-18 | Discharge: 2023-04-18 | Disposition: A | Discharge status: Home / Self Care | Payer: 59 | Source: Ambulatory Visit | Attending: Gastroenterology | Admitting: Gastroenterology

## 2023-04-18 DIAGNOSIS — R933 Abnormal findings on diagnostic imaging of other parts of digestive tract: Secondary | ICD-10-CM | POA: Insufficient documentation

## 2023-04-18 MED ORDER — SODIUM CHLORIDE (PF) 0.9 % IJ SOLN
INTRAMUSCULAR | Status: AC
  Filled 2023-04-18: qty 50

## 2023-04-18 MED ORDER — IOHEXOL 300 MG/ML  SOLN
100.0000 mL | Freq: Once | INTRAMUSCULAR | Status: AC | PRN
  Administered 2023-04-18: 100 mL via INTRAVENOUS

## 2023-04-19 ENCOUNTER — Other Ambulatory Visit: Payer: Self-pay | Admitting: Family Medicine

## 2023-04-19 DIAGNOSIS — R7302 Impaired glucose tolerance (oral): Secondary | ICD-10-CM

## 2023-04-19 DIAGNOSIS — Z1329 Encounter for screening for other suspected endocrine disorder: Secondary | ICD-10-CM

## 2023-04-19 DIAGNOSIS — E7800 Pure hypercholesterolemia, unspecified: Secondary | ICD-10-CM

## 2023-04-19 DIAGNOSIS — Z0000 Encounter for general adult medical examination without abnormal findings: Secondary | ICD-10-CM

## 2023-04-20 ENCOUNTER — Other Ambulatory Visit: Payer: Self-pay

## 2023-04-20 DIAGNOSIS — N838 Other noninflammatory disorders of ovary, fallopian tube and broad ligament: Secondary | ICD-10-CM

## 2023-04-25 ENCOUNTER — Encounter: Payer: Self-pay | Admitting: Obstetrics and Gynecology

## 2023-04-25 ENCOUNTER — Ambulatory Visit: Payer: 59 | Admitting: Obstetrics and Gynecology

## 2023-04-25 VITALS — BP 120/77 | HR 79 | Wt 177.0 lb

## 2023-04-25 DIAGNOSIS — N83202 Unspecified ovarian cyst, left side: Secondary | ICD-10-CM

## 2023-04-25 DIAGNOSIS — Z01419 Encounter for gynecological examination (general) (routine) without abnormal findings: Secondary | ICD-10-CM | POA: Diagnosis not present

## 2023-04-25 NOTE — Progress Notes (Signed)
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

## 2023-04-25 NOTE — Progress Notes (Signed)
Subjective:    Julie Garcia is a 52 y.o. female who presents to establish care. Patient recently had a CT scan which demonstrated the presence of a complex left ovarian cyst. Patient denies any pelvic pain or abnormal discharge. She denies any change in weight or bloating. Patient had a hysterectomy 15 years ago for fibroid uterus. She denies any episodes of vaginal bleeding. She denies any vasomotor symptoms. Patient is without any other complaints other than need to follow up on abnormal CT findings  Menstrual History: OB History   No obstetric history on file.      No LMP recorded. Patient has had a hysterectomy.   Past Medical History:  Diagnosis Date   Neuropathy    Past Surgical History:  Procedure Laterality Date   CESAREAN SECTION     PARTIAL HYSTERECTOMY     TONSILLECTOMY     Family History  Problem Relation Age of Onset   Endometriosis Mother    Gallbladder disease Mother    Colon polyps Father    Pulmonary embolism Father    Hyperlipidemia Father    Pulmonary embolism Brother    Depression Brother    Pancreatic cancer Paternal Grandmother    Breast cancer Neg Hx    Colon cancer Neg Hx    Esophageal cancer Neg Hx    Rectal cancer Neg Hx    Stomach cancer Neg Hx    Social History   Tobacco Use   Smoking status: Never   Smokeless tobacco: Never  Vaping Use   Vaping Use: Never used  Substance Use Topics   Alcohol use: Yes    Comment: 2 glasses of wine or beer a nigh   Drug use: No       Review of Systems Pertinent items noted in HPI and remainder of comprehensive ROS otherwise negative.    Objective:    GENERAL: Well-developed, well-nourished female in no acute distress.  NEURO: alert and oriented x 3 .   CT Abdomen Pelvis W Contrast  Result Date: 04/18/2023 CLINICAL DATA:  Chronic right-sided abdominal pain. Abnormal colonoscopy. EXAM: CT ABDOMEN AND PELVIS WITH CONTRAST TECHNIQUE: Multidetector CT imaging of the abdomen and pelvis was  performed using the standard protocol following bolus administration of intravenous contrast. RADIATION DOSE REDUCTION: This exam was performed according to the departmental dose-optimization program which includes automated exposure control, adjustment of the mA and/or kV according to patient size and/or use of iterative reconstruction technique. CONTRAST:  OMNIPAQUE IOHEXOL 300 MG/ML  SOLN COMPARISON:  None Available. FINDINGS: Lower Chest: No acute findings. Hepatobiliary: No hepatic masses identified. Gallbladder is unremarkable. No evidence of biliary ductal dilatation. Pancreas:  No mass or inflammatory changes. Spleen: Within normal limits in size and appearance. Adrenals/Urinary Tract: No suspicious masses identified. No evidence of ureteral calculi or hydronephrosis. Stomach/Bowel: No evidence of obstruction, inflammatory process or abnormal fluid collections. Normal appendix visualized. Vascular/Lymphatic: No pathologically enlarged lymph nodes. No acute vascular findings. Reproductive: Uterus is unremarkable. A complex cystic lesion is seen in the right adnexa which contains a few internal septations and possible small mural nodule. This measures 4.1 x 3.4 cm, and is suspicious for cystic ovarian neoplasm. No evidence of peritoneal nodularity or ascites. Other:  None. Musculoskeletal:  No suspicious bone lesions identified. IMPRESSION: 4.1 cm complex cystic lesion in right adnexa. Cystic ovarian neoplasm cannot be excluded. Recommend further evaluation with pelvic MRI without and with contrast in 2-3 months. Electronically Signed   By: Dietrich Pates.D.  On: 04/18/2023 20:28    Assessment:    52 yo with left ovarian cyst.    Plan:     Patient current on pap smear and mammogram Patient had recent colonoscopy Will order CA-125 Pelvic ultrasound ordered Patient to return in 2 weeks to discuss further management of ovarian cyst Will contact radiology department to re-read CT scan as  patient had a hysterectomy

## 2023-04-26 LAB — CA 125: Cancer Antigen (CA) 125: 7.8 U/mL (ref 0.0–38.1)

## 2023-05-02 ENCOUNTER — Ambulatory Visit (HOSPITAL_BASED_OUTPATIENT_CLINIC_OR_DEPARTMENT_OTHER)
Admission: RE | Admit: 2023-05-02 | Discharge: 2023-05-02 | Disposition: A | Discharge status: Home / Self Care | Payer: 59 | Source: Ambulatory Visit | Attending: Obstetrics and Gynecology | Admitting: Obstetrics and Gynecology

## 2023-05-02 DIAGNOSIS — N83202 Unspecified ovarian cyst, left side: Secondary | ICD-10-CM | POA: Diagnosis present

## 2023-05-09 ENCOUNTER — Other Ambulatory Visit: Payer: 59

## 2023-05-09 DIAGNOSIS — R7302 Impaired glucose tolerance (oral): Secondary | ICD-10-CM

## 2023-05-09 DIAGNOSIS — E7800 Pure hypercholesterolemia, unspecified: Secondary | ICD-10-CM

## 2023-05-09 DIAGNOSIS — Z0000 Encounter for general adult medical examination without abnormal findings: Secondary | ICD-10-CM

## 2023-05-09 DIAGNOSIS — Z1329 Encounter for screening for other suspected endocrine disorder: Secondary | ICD-10-CM

## 2023-05-10 ENCOUNTER — Encounter: Payer: Self-pay | Admitting: Family Medicine

## 2023-05-10 ENCOUNTER — Ambulatory Visit: Payer: 59 | Admitting: Family Medicine

## 2023-05-10 VITALS — BP 134/83 | HR 78 | Wt 179.0 lb

## 2023-05-10 DIAGNOSIS — R1031 Right lower quadrant pain: Secondary | ICD-10-CM | POA: Diagnosis not present

## 2023-05-10 DIAGNOSIS — N9489 Other specified conditions associated with female genital organs and menstrual cycle: Secondary | ICD-10-CM | POA: Diagnosis not present

## 2023-05-10 HISTORY — DX: Other specified conditions associated with female genital organs and menstrual cycle: N94.89

## 2023-05-10 LAB — LIPID PANEL
Chol/HDL Ratio: 2 ratio (ref 0.0–4.4)
Cholesterol, Total: 190 mg/dL (ref 100–199)
HDL: 96 mg/dL (ref >39)
LDL Chol Calc (NIH): 81 mg/dL (ref 0–99)
Triglycerides: 74 mg/dL (ref 0–149)
VLDL Cholesterol Cal: 13 mg/dL (ref 5–40)

## 2023-05-10 LAB — HEMOGLOBIN A1C
Est. average glucose Bld gHb Est-mCnc: 126 mg/dL
Hgb A1c MFr Bld: 6 % — ABNORMAL HIGH (ref 4.8–5.6)

## 2023-05-10 NOTE — Progress Notes (Signed)
    Subjective:    Patient ID: KASHMIR PANEPINTO is a 52 y.o. female presenting with Follow-up  on 05/10/2023  HPI: Patient with RLQ pain. Has had [facility] many years ago. Had colonoscopy and noted some external compression at ascending colon. Underwent CT which showed a cystic adnexal mass on right. She had normal CA 125 at first visit. She reports some discomfort in this area, that radiates to her back.   Review of Systems  Constitutional:  Negative for chills and fever.  Respiratory:  Negative for shortness of breath.   Cardiovascular:  Negative for chest pain.  Gastrointestinal:  Positive for abdominal pain. Negative for nausea and vomiting.  Genitourinary:  Negative for dysuria.  Skin:  Negative for rash.      Objective:    BP 134/83   Pulse 78   Wt 179 lb (81.2 kg)   BMI 26.43 kg/m  Physical Exam Exam conducted with a chaperone present.  Constitutional:      General: She is not in acute distress.    Appearance: She is well-developed.  HENT:     Head: Normocephalic and atraumatic.  Eyes:     General: No scleral icterus. Cardiovascular:     Rate and Rhythm: Normal rate.  Pulmonary:     Effort: Pulmonary effort is normal.  Abdominal:     Palpations: Abdomen is soft.  Musculoskeletal:     Cervical back: Neck supple.  Skin:    General: Skin is warm and dry.  Neurological:     Mental Status: She is alert and oriented to person, place, and time.    I independently reviewed the pelvic sonogram images. Cervix is present. There is a cystic mass on right.     Assessment & Plan:   Problem List Items Addressed This Visit       Unprioritized   Adnexal mass    With normal CA125 and On right, and c/w paraovarian cyst or hydrosalpinx. Discussed options of surgical exploration with removal vs. Repeat imaging soon. We will perform an MRI in 3 months, with plans to intervene sooner if new symptoms as suggested by Radiology on CT results.      Other Visit Diagnoses     Right  lower quadrant abdominal pain    -  Primary   Relevant Orders   MR PELVIS W WO CONTRAST        Return in about 3 months (around 08/10/2023) for a follow-up.  Reva Bores, MD 05/10/2023 4:14 PM

## 2023-05-10 NOTE — Progress Notes (Signed)
CC: follow up on left ovarian cyst   US done on 05/02/23

## 2023-05-10 NOTE — Assessment & Plan Note (Addendum)
With normal CA125 and On right, and c/w paraovarian cyst or hydrosalpinx. Discussed options of surgical exploration with removal vs. Repeat imaging soon. We will perform an MRI in 3 months, with plans to intervene sooner if new symptoms as suggested by Radiology on CT results.

## 2023-05-16 ENCOUNTER — Encounter: Payer: Self-pay | Admitting: Family Medicine

## 2023-05-16 ENCOUNTER — Ambulatory Visit: Payer: 59 | Admitting: Family Medicine

## 2023-05-16 VITALS — BP 126/83 | HR 82 | Resp 18 | Ht 69.0 in | Wt 182.0 lb

## 2023-05-16 DIAGNOSIS — E663 Overweight: Secondary | ICD-10-CM | POA: Diagnosis not present

## 2023-05-16 DIAGNOSIS — N9489 Other specified conditions associated with female genital organs and menstrual cycle: Secondary | ICD-10-CM

## 2023-05-16 DIAGNOSIS — G629 Polyneuropathy, unspecified: Secondary | ICD-10-CM

## 2023-05-16 DIAGNOSIS — N951 Menopausal and female climacteric states: Secondary | ICD-10-CM | POA: Diagnosis not present

## 2023-05-16 NOTE — Assessment & Plan Note (Signed)
Continue follow-up with neurology next month.  Reviewed neurology's notes and response to labs, immunoglobulin a was not elevated enough to be considered clinically significant and did not have any monoclonal proteins.

## 2023-05-16 NOTE — Assessment & Plan Note (Signed)
Continue with plan developed by OB/GYN.  CA-125 within normal limits, ultrasound consistent with paraovarian cyst or hydrosalpinx.  Patient is planning on doing MRI in about 3 months.  We will follow-up after that time to coordinate care with OB/GYN and MRI results.

## 2023-05-16 NOTE — Progress Notes (Signed)
Established Patient Office Visit  Subjective   Patient ID: Julie Garcia, female    DOB: 1971-01-16  Age: 52 y.o. MRN: [id number]  Chief Complaint  Patient presents with   Results    HPI Julie Garcia is a 52 y.o. female presenting today for follow up of prediabetes, elevated LDL, neuropathy. She underwent a CT scan with gastroenterology which demonstrated the presence of a complex left ovarian cyst.  She established with Dr. Jolayne Panther OB/GYN at [facility] for women's health care at [facility] [address]  CEA-125 and pelvic ultrasound ordered, CEA-125 within normal limits and ultrasound consistent with paraovarian cyst or hydrosalpinx.  OB/GYN discussed surgical exploration with removal versus repeat imaging.  Planning MRI in 3 months with sooner intervention if new symptoms develop. She has seen neurology since her last visit for evaluation of foot neuropathy with recommendation to return in July.  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  Collected labs including vitamin B1, B6, IFE.  Also considered EMG, if negative recommended skin biopsy.  Of lab results, vitamin D1 was low and recommended to start B1 supplement.  IgA elevated at 321, not high enough that Dr. Loleta Chance was concerned.  There was a normal pattern with no monoclonal proteins.  ROS Negative unless otherwise noted in HPI   Objective:     BP 126/83 (BP Location: Left Arm, Patient Position: Sitting, Cuff Size: Normal)   Pulse 82   Resp 18   Ht 5\' 9"  (1.753 m)   Wt 182 lb (82.6 kg)   SpO2 97%   BMI 26.88 kg/m   Physical Exam Constitutional:      General: She is not in acute distress.    Appearance: Normal appearance.  HENT:     Head: Normocephalic and atraumatic.  Pulmonary:     Effort: Pulmonary effort is normal. No respiratory distress.  Musculoskeletal:     Cervical back: Normal range of motion.  Neurological:     General: No focal  deficit present.     Mental Status: She is alert and oriented to person, place, and time. Mental status is at baseline.  Psychiatric:        Mood and Affect: Mood normal.        Thought Content: Thought content normal.        Judgment: Judgment normal.     Assessment & Plan:  Neuropathy Assessment & Plan: Continue follow-up with neurology next month.  Reviewed neurology's notes and response to labs, immunoglobulin a was not elevated enough to be considered clinically significant and did not have any monoclonal proteins.   Adnexal mass Assessment & Plan: Continue with plan developed by OB/GYN.  CA-125 within normal limits, ultrasound consistent with paraovarian cyst or hydrosalpinx.  Patient is planning on doing MRI in about 3 months.  We will follow-up after that time to coordinate care with OB/GYN and MRI results.   Overweight (BMI 25.0-29.9) Assessment & Plan: LDL is fantastic, decreased from 107-81.  A1c remains fairly stable, was 5.9 now 6.0.  We discussed that given the fact that she is nutritionally and physically doing all the right things which is reflected in her cholesterol labs, A1c is likely being impacted by other hormones.  She is going to think about having menopausal labs drawn, I will order them so that if she decides to call to schedule lab appointment they are available.  We will follow-up in about 5 months to reevaluate.  Menopausal symptom -     FSH/LH; Future    Return in about 4 months (around 09/15/2023) for follow-up for neuropathy, ovarian cyst, A1c.    Melida Quitter, PA

## 2023-05-16 NOTE — Assessment & Plan Note (Signed)
LDL is fantastic, decreased from 107-81.  A1c remains fairly stable, was 5.9 now 6.0.  We discussed that given the fact that she is nutritionally and physically doing all the right things which is reflected in her cholesterol labs, A1c is likely being impacted by other hormones.  She is going to think about having menopausal labs drawn, I will order them so that if she decides to call to schedule lab appointment they are available.  We will follow-up in about 5 months to reevaluate.

## 2023-05-25 ENCOUNTER — Other Ambulatory Visit: Payer: 59

## 2023-06-14 ENCOUNTER — Encounter: Payer: 59 | Admitting: Family Medicine

## 2023-06-19 NOTE — Progress Notes (Signed)
NEUROLOGY FOLLOW UP OFFICE NOTE  ED RAYSON [id number]  Subjective:  Julie Garcia is a 52 y.o. year old right-handed female with a medical history of pre-diabetes who we last saw on 03/09/23.  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 She first noticed symptoms around 10/2020. She had achiness and burning of both feet. She was told she had neuropathy by her PCP. She had a shot in her foot at one point that did not help. Symptoms are worse at night. Her symptoms have not progressed past her feet.   She currently has burning/hotness in feet without change in color of her feet. She is very sensitive in her feet, such that her husband cannot touch her feet. She feels like a sock is bunched up under her feet. She has numbness in her toes as well. She has to wear socks with ice packs at night. She has noticed that over time she does not notice the symptoms as much (getting used to the symptoms vs getting better). She has noticed that touching her chin to her chest causes electric shocks in her feet. There is no electric sensation in her neck. She does endorse low back pain but without sciatica like pain. She denies imbalance and falls.   She has seen PT in the past and had dry needling. This did not help.   Patient wants to make sure this is neuropathy, that it does not get worse.   She does not report any constitutional symptoms like fever, night sweats, anorexia or unintentional weight loss.   EtOH use: couple of glasses of wine during the week, and 3-4 glasses of beer on weekend  Restrictive diet? No Family history of neuropathy/myopathy/NM disease? No   Patient has never had an EMG. She has never been on medications for her symptoms and does not want to.   She also mentions "shaking of hands". Father also has the symptoms. She does not think EtOH changes the shakiness.  Most recent Assessment and Plan (03/09/23): Julie Garcia is a 52 y.o. female who  presents for evaluation of numbness, tingling, and burning in feet. She has a relevant medical history of pre-diabetes. Her neurological examination is pertinent for diminished sensation in bilateral lower extremities to pin prick in a distal to proximal gradient. Available diagnostic data is significant for HbA1c of 5.9, B12 of 432, and normal folate. 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 Her known risk factors include pre-diabetes. She does drink EtOH, but in moderation. I will send labs to look for other treatable causes and get an EMG to clarify.   She also mentions a tremor in her hands, worse on the left. This is consistent with essential tremor. She is not currently interested in treatment for neuropathy or tremor.   PLAN: -Blood work: B1, B6, IFE -EMG: PN (R > L) -Will consider skin biopsy if negative -Lidocaine cream PRN -Alpha lipoic acid 600 mg once or twice daily  Since their last visit: Patient's labs were significant to thiamine deficiency (B1 was 6). B6 and IFE were unremarkable. I recommended B1 100 mg daily.  She is taking 300 mg daily. She feels like her symptoms have improved. She continues to have burning pain, but this is more tolerable. She still feels hotness and will use an ice pack, but no color changes in feet. She mentions she can sometimes bend her neck and feel the pain in  her feet.  Patient cancelled EMG with me. She had a lot to do at the time, so put it off. She is willing to reschedule this now.   MEDICATIONS:  Outpatient Encounter Medications as of 06/30/2023  Medication Sig   cetirizine (ZYRTEC) 10 MG tablet Take 10 mg by mouth as needed for allergies.   thiamine (VITAMIN B-1) 100 MG tablet Take 300 mg by mouth daily.   No facility-administered encounter medications on file as of 06/30/2023.    PAST MEDICAL HISTORY: Past Medical History:  Diagnosis Date   Neuropathy     PAST SURGICAL  HISTORY: Past Surgical History:  Procedure Laterality Date   CESAREAN SECTION     PARTIAL HYSTERECTOMY     TONSILLECTOMY      ALLERGIES: No Known Allergies  FAMILY HISTORY: Family History  Problem Relation Age of Onset   Endometriosis Mother    Gallbladder disease Mother    Colon polyps Father    Pulmonary embolism Father    Hyperlipidemia Father    Pulmonary embolism Brother    Depression Brother    Pancreatic cancer Paternal Grandmother    Breast cancer Neg Hx    Colon cancer Neg Hx    Esophageal cancer Neg Hx    Rectal cancer Neg Hx    Stomach cancer Neg Hx     SOCIAL HISTORY: Social History   Tobacco Use   Smoking status: Never    Passive exposure: Never   Smokeless tobacco: Never  Vaping Use   Vaping status: Never Used  Substance Use Topics   Alcohol use: Yes    Comment: 2 glasses of wine or beer a nigh   Drug use: No   Social History   Social History Narrative   Are you right handed or left handed? right   Are you currently employed ? 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 Husband and child   What type of home do you live in: 1 story or 2 story? two    Caffeine 1 cup in am      Objective:  Vital Signs:  BP 124/76   Pulse 81   Ht 5\' 9"  (1.753 m)   Wt 182 lb 3.2 oz (82.6 kg)   SpO2 99%   BMI 26.91 kg/m   General: No acute distress.  Patient appears well-groomed.   Head:  Normocephalic/atraumatic Neck: supple, no paraspinal tenderness, full range of motion Lungs: Non-labored breathing on room air  Neurological Exam: Mental status: alert and oriented, speech fluent and not dysarthric, language intact.  Cranial nerves: CN I: not tested CN II: pupils equal, round and reactive to light, visual fields intact CN III, IV, VI:  full range of motion, no nystagmus, no ptosis CN V: facial sensation intact. CN VII: upper and lower face symmetric CN VIII: hearing intact CN IX,  X: uvula midline CN XI: sternocleidomastoid and trapezius muscles intact CN XII: tongue midline  Bulk & Tone: normal, no fasciculations. Motor:  muscle strength 5/5 throughout Deep Tendon Reflexes:  2+ throughout,  toes downgoing.   Sensation:  Pinprick sensation diminished in bilateral lower extremities to mid calf. Intact in upper extremities. Finger to nose testing:  Without dysmetria.   Gait:  Normal station and stride.  Romberg negative.   Labs and Imaging review: New results: 03/09/23: B1: 6 B6 wnl IFE: no M protein  05/09/23: HbA1c: 6.0 Lipid panel:  total cholesterol 190, LDL 81  Previously reviewed results: 02/09/23: TSH wnl HbA1c: 5.9 CMP wnl CBC wnl   05/05/22: Vit D wnl Folate wnl B12: 432  Assessment/Plan:  This is AYRIANNA MCGINNISS, a 52 y.o. female with burning, tingling, and numbness in bilateral lower extremities. Symptoms could be consistent with a distal symmetric neuropathy, maybe small fiber given the prominent burning. She has known risk factors of thiamine deficiency and pre-diabetes. She has improved some since last visit and starting thiamine supplementation.   Plan: -EMG: PN protocol (L > R) -May consider skin biopsy and/or MRI brain and cervical spine w/wo contrast if EMG is not revealing -Discussed medications. Patient not currently interested. -Lidocaine cream PRN -Can try alpha lipoic acid 600 mg once or twice daily -Thiamine (B1) 100 mg daily  Return to clinic to be determined after EMG by patient preference  Total time spent reviewing records, interview, history/exam, documentation, and coordination of care on day of encounter:  30 min  Jacquelyne Balint, MD

## 2023-06-30 ENCOUNTER — Encounter: Payer: Self-pay | Admitting: Neurology

## 2023-06-30 ENCOUNTER — Ambulatory Visit: Payer: 59 | Admitting: Neurology

## 2023-06-30 VITALS — BP 124/76 | HR 81 | Ht 69.0 in | Wt 182.2 lb

## 2023-06-30 DIAGNOSIS — G250 Essential tremor: Secondary | ICD-10-CM

## 2023-06-30 DIAGNOSIS — G629 Polyneuropathy, unspecified: Secondary | ICD-10-CM | POA: Diagnosis not present

## 2023-06-30 DIAGNOSIS — R209 Unspecified disturbances of skin sensation: Secondary | ICD-10-CM

## 2023-06-30 NOTE — Patient Instructions (Signed)
-  EMG -Continue B1 (thiamine) - 100 mg daily is enough  You can also try Lidocaine cream as needed. Apply wear you have pain, tingling, or burning. Wear gloves to prevent your hands being numb. This can be bought over the counter at any drug store or online.  Alpha lipoic acid 600mg  daily has some research data suggesting it helps with nerve health. No major side effects other than <1% of people report upset stomach. This can be taken twice per day (1200mg  daily) if no relief obtained. You can buy this over the counter or online.  The physicians and staff at [facility] are committed to providing excellent care. You may receive a survey requesting feedback about your experience at our office. We strive to receive "very good" responses to the survey questions. If you feel that your experience would prevent you from giving the office a "very good " response, please contact our office to try to remedy the situation. We may be reached at [phone]. Thank you for taking the time out of your busy day to complete the survey.  Jacquelyne Balint, MD [facility]  ELECTROMYOGRAM AND NERVE CONDUCTION STUDIES (EMG/NCS) INSTRUCTIONS  How to Prepare The neurologist conducting the EMG will need to know if you have certain medical conditions. Tell the neurologist and other EMG lab personnel if you: Have a pacemaker or any other electrical medical device Take blood-thinning medications Have hemophilia, a blood-clotting disorder that causes prolonged bleeding Bathing Take a shower or bath shortly before your exam in order to remove oils from your skin. Don't apply lotions or creams before the exam.  What to Expect You'll likely be asked to change into a hospital gown for the procedure and lie down on an examination table. The following explanations can help you understand what will happen during the exam.  Electrodes. The neurologist or a technician places surface electrodes at various locations on your  skin depending on where you're experiencing symptoms. Or the neurologist may insert needle electrodes at different sites depending on your symptoms.  Sensations. The electrodes will at times transmit a tiny electrical current that you may feel as a twinge or spasm. The needle electrode may cause discomfort or pain that usually ends shortly after the needle is removed. If you are concerned about discomfort or pain, you may want to talk to the neurologist about taking a short break during the exam.  Instructions. During the needle EMG, the neurologist will assess whether there is any spontaneous electrical activity when the muscle is at rest - activity that isn't present in healthy muscle tissue - and the degree of activity when you slightly contract the muscle.  He or she will give you instructions on resting and contracting a muscle at appropriate times. Depending on what muscles and nerves the neurologist is examining, he or she may ask you to change positions during the exam.  After your EMG You may experience some temporary, minor bruising where the needle electrode was inserted into your muscle. This bruising should fade within several days. If it persists, contact your primary care doctor.

## 2023-08-11 ENCOUNTER — Other Ambulatory Visit: Payer: 59

## 2023-08-21 ENCOUNTER — Encounter: Payer: 59 | Admitting: Neurology

## 2023-09-11 ENCOUNTER — Ambulatory Visit: Payer: 59 | Admitting: Neurology

## 2023-09-11 ENCOUNTER — Encounter: Payer: 59 | Admitting: Neurology

## 2023-09-11 ENCOUNTER — Telehealth: Payer: Self-pay | Admitting: Neurology

## 2023-09-11 DIAGNOSIS — R209 Unspecified disturbances of skin sensation: Secondary | ICD-10-CM | POA: Diagnosis not present

## 2023-09-11 DIAGNOSIS — G629 Polyneuropathy, unspecified: Secondary | ICD-10-CM

## 2023-09-11 DIAGNOSIS — G250 Essential tremor: Secondary | ICD-10-CM

## 2023-09-11 NOTE — Telephone Encounter (Signed)
Discussed the results of patient's EMG after the procedure today. EMG was normal with no evidence of neuropathy, radiculopathy, or myopathy. I discussed with patient that EMG only tests for large fiber neuropathy, and given that her symptoms are mostly symmetric burning pain, small fiber neuropathy is possible. This could be tested for with skin biopsy. Patient was in agreement. I will order and get patient on my schedule for this procedure.  All questions were answered.  Jacquelyne Balint, MD [facility]

## 2023-09-11 NOTE — Procedures (Signed)
  [facility]  [address], [address]  [place], [place] [address] Tel: [phone] Fax: [phone] Test Date:  09/11/2023  Patient: Julie Garcia DOB: 1971-11-03 Physician: Jacquelyne Balint, MD  Sex: Female Height: 5\' 9"  Ref Phys: Jacquelyne Balint, MD  ID#: [id number]   Technician:    History: This is a 52 year old female with numbness, tingling, and burning in her feet.  NCV & EMG Findings: Extensive electrodiagnostic evaluation of the left lower limb shows: Left sural and superficial peroneal/fibular sensory responses are within normal limits. Left peroneal/fibular (EDB) and tibial (AH) motor responses are within normal limits. Left H reflex latency is within normal limits. There is no evidence of active or chronic motor axon loss changes affecting any of the tested muscles. Motor unit configuration and recruitment pattern is within normal limits.  Impression: This is a normal study of the left lower limb. In particular, there is no electrodiagnostic evidence of a left lumbosacral (L3-S1) radiculopathy, large fiber sensorimotor neuropathy, or myopathy.     ___________________________ Jacquelyne Balint, MD    Nerve Conduction Studies Motor Nerve Results    Latency Amplitude F-Lat Segment Distance CV Comment  Site (ms) Norm (mV) Norm (ms)  (cm) (m/s) Norm   Left Fibular (EDB) Motor  Ankle 3.2  < 6.0 3.4  > 2.5        Bel fib head 9.6 - 2.2 -  Bel fib head-Ankle 32 50  > 40   Pop fossa 11.6 - 2.2 -  Pop fossa-Bel fib head 10 50 -   Left Tibial (AH) Motor  Ankle 3.0  < 6.0 18.6  > 4.0        Knee 11.6 - 12.9 -  Knee-Ankle 39 45  > 40    Sensory Sites    Neg Peak Lat Amplitude (O-P) Segment Distance Velocity Comment  Site (ms) Norm (V) Norm  (cm) (ms)   Left Superficial Fibular Sensory  14 cm-Ankle 2.6  < 4.6 6  > 4 14 cm-Ankle 14    Left Sural Sensory  Calf-Lat mall 4.0  < 4.6 10  > 4 Calf-Lat mall 14     H-Reflex Results    M-Lat H Lat H Neg Amp H-M Lat  Site  (ms) (ms) Norm (mV) (ms)  Left Tibial H-Reflex  Pop fossa 5.0 32.0  < 35.0 1.17 27.0   Electromyography   Side Muscle Ins.Act Fibs Fasc Recrt Amp Dur Poly Activation Comment  Left Tib ant Nml Nml Nml Nml Nml Nml Nml Nml N/A  Left Gastroc MH Nml Nml Nml Nml Nml Nml Nml Nml N/A  Left FDL Nml Nml Nml Nml Nml Nml Nml Nml N/A  Left EDB Nml Nml Nml Nml Nml Nml Nml Nml N/A  Left AH Nml Nml Nml Nml Nml Nml Nml Nml N/A  Left Rectus fem Nml Nml Nml Nml Nml Nml Nml Nml N/A  Left Biceps fem SH Nml Nml Nml Nml Nml Nml Nml Nml N/A  Left Gluteus med Nml Nml Nml Nml Nml Nml Nml Nml N/A      Waveforms:  Motor      Sensory      H-Reflex

## 2023-09-16 ENCOUNTER — Ambulatory Visit
Admission: RE | Admit: 2023-09-16 | Discharge: 2023-09-16 | Disposition: A | Discharge status: Home / Self Care | Payer: 59 | Source: Ambulatory Visit | Attending: Family Medicine | Admitting: Family Medicine

## 2023-09-16 DIAGNOSIS — R1031 Right lower quadrant pain: Secondary | ICD-10-CM

## 2023-09-16 MED ORDER — GADOPICLENOL 0.5 MMOL/ML IV SOLN
8.0000 mL | Freq: Once | INTRAVENOUS | Status: AC | PRN
  Administered 2023-09-16: 8 mL via INTRAVENOUS

## 2023-10-17 ENCOUNTER — Ambulatory Visit (INDEPENDENT_AMBULATORY_CARE_PROVIDER_SITE_OTHER): Payer: 59 | Admitting: Neurology

## 2023-10-17 DIAGNOSIS — R209 Unspecified disturbances of skin sensation: Secondary | ICD-10-CM

## 2023-10-17 NOTE — Progress Notes (Signed)
Punch Biopsy Procedure Note  Preprocedure Diagnosis: disturbance of skin sensation   Postprocedure Diagnosis: same  Locations: Site 1: Right lateral distal leg;  Site 2: Right lateral thigh  Indications: r/o small fiber neuropathy  Anesthesia: 5 mL Lidocaine 1% with epinephrine  Procedure Details Patient informed of the risks (including but not limited to bleeding, pain, infection, scar and infection) and benefits of the procedure.  Informed consent obtained.  The areas which were chosen for biopsy, as above, and surrounding areas were given a sterile prep using alcohol and iodine. The skin was then stretched perpendicular to the skin tension lines and sample removed using the 3 mm punch. Pressure applied, hemostasis achieved.   Dressing applied. The specimen(s) was sent for pathologic examination. The patient tolerated the procedure well.  Estimated Blood Loss: 1 ml  Condition: Stable  Complications: none.  Plan: 1. Instructed to keep the wound dry and covered for 24h and clean thereafter. 2. Warning signs of infection were reviewed.    Jacquelyne Balint, MD [facility]

## 2023-10-26 ENCOUNTER — Telehealth: Payer: Self-pay | Admitting: Neurology

## 2023-10-26 NOTE — Telephone Encounter (Signed)
Called patient to discuss the results of her skin biopsy from 10/17/23. It showed very low epidermal nerve fiber density at the ankle (0.07 with 4.4 being the lower limit of normal) and low at the thigh (7.98 with 8.3 being the lower limit of normal). 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   We discussed neuropathic pain medication such as gabapentin. Patient will think about this.  I also recommended follow up at least yearly with me, sooner if needed.  All questions were answered.  Jacquelyne Balint, MD [facility]

## 2024-01-11 ENCOUNTER — Encounter: Payer: Self-pay | Admitting: Family Medicine

## 2024-05-30 ENCOUNTER — Other Ambulatory Visit (HOSPITAL_COMMUNITY)
Admission: RE | Admit: 2024-05-30 | Discharge: 2024-05-30 | Disposition: A | Discharge status: Home / Self Care | Source: Ambulatory Visit | Attending: Obstetrics and Gynecology | Admitting: Obstetrics and Gynecology

## 2024-05-30 ENCOUNTER — Ambulatory Visit: Admitting: Obstetrics and Gynecology

## 2024-05-30 ENCOUNTER — Encounter: Payer: Self-pay | Admitting: Obstetrics and Gynecology

## 2024-05-30 VITALS — BP 133/80 | HR 97 | Ht 69.0 in | Wt 188.0 lb

## 2024-05-30 DIAGNOSIS — Z124 Encounter for screening for malignant neoplasm of cervix: Secondary | ICD-10-CM

## 2024-05-30 DIAGNOSIS — R1031 Right lower quadrant pain: Secondary | ICD-10-CM

## 2024-05-30 DIAGNOSIS — Z01419 Encounter for gynecological examination (general) (routine) without abnormal findings: Secondary | ICD-10-CM | POA: Diagnosis present

## 2024-05-30 DIAGNOSIS — E519 Thiamine deficiency, unspecified: Secondary | ICD-10-CM

## 2024-05-30 DIAGNOSIS — R635 Abnormal weight gain: Secondary | ICD-10-CM

## 2024-05-30 DIAGNOSIS — Z8742 Personal history of other diseases of the female genital tract: Secondary | ICD-10-CM

## 2024-05-30 DIAGNOSIS — Z1331 Encounter for screening for depression: Secondary | ICD-10-CM

## 2024-05-30 NOTE — Progress Notes (Signed)
 Patient presents for Annual.  Last pap: 12/29/20 WNL  Contraception: Partial HYST  Mammogram: 03/22/23  pt states she will schedule. STD Screening: Declines   CC: possible cyst, perimenopause and weight gain

## 2024-05-30 NOTE — Progress Notes (Signed)
 Obstetrics and Gynecology New Patient Evaluation  Appointment Date: 05/30/2024  OBGYN Clinic: [facility]   Primary Care Provider: Wallace Search A  Chief Complaint:  Chief Complaint  Patient presents with   Gynecologic Exam    History of Present Illness: Julie Garcia is a 53 y.o.  G3P3 , seen for the above chief complaint. Her past medical history is significant for h/o 2010 l/s [facility] for fibroids, h/o complex right adnexal cyst  Patient having issues with weight gain and wondering if this could be due to menopausal transition. No hot flashes, night sweats, vaginal dryness. Patient also with rlq discomfort similar to prior cyst pain. Patient unsure when it started or if it ever went away even after the negative MRI. She was followed for this in 2024 after May CT by GI for the pain. CT and subsequent u/s showed 4-5cm complex right adnexal cyst. She had a negative CA125 and an MRI in October that showed it had resolved.   No GI or GYN s/s.   Review of Systems: Pertinent items noted in HPI and remainder of comprehensive ROS otherwise negative.   Patient Active Problem List   Diagnosis Date Noted   Neuropathy 02/14/2023   Elevated LDL cholesterol level 05/05/2022   Glucose intolerance (impaired glucose tolerance) 05/18/2017   Environmental and seasonal allergies 05/02/2017   Overweight (BMI 25.0-29.9) 05/02/2017   Plantar fascia syndrome 05/02/2017    Past Medical History:  Past Medical History:  Diagnosis Date   Neuropathy    Past Surgical History:  Past Surgical History:  Procedure Laterality Date   CESAREAN SECTION     LAPAROSCOPIC SUPRACERVICAL HYSTERECTOMY  2010   fibroids   TONSILLECTOMY     Past Obstetrical History:  OB History  Gravida Para Term Preterm AB Living  3 3 1 2     SAB IAB Ectopic Multiple Live Births     1 3    # Outcome Date GA Lbr Len/2nd Weight Sex Type Anes PTL Lv  3 Preterm           2 Preterm           1  Term      VBAC      Past Gynecological History: As per HPI. History of Pap Smear(s): Yes.   Last pap 2022, which was cytology negative  Social History:  Social History   Socioeconomic History   Marital status: Married    Spouse name: Not on file   Number of children: 3   Years of education: Not on file   Highest education level: Not on file  Occupational History   Not on file  Tobacco Use   Smoking status: Never    Passive exposure: Never   Smokeless tobacco: Never  Vaping Use   Vaping status: Never Used  Substance and Sexual Activity   Alcohol use: Yes    Comment: 2 glasses of wine or beer a nigh   Drug use: No   Sexual activity: Yes    Partners: Male    Comment: Married-Julie Garcia  Other Topics Concern   Not on file  Social History Narrative   Are you right handed or left handed? right   Are you currently employed ? 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 Husband and child   What type of home do you live in: 1 story or 2 story?  two    Caffeine 1 cup in am   Social Drivers of Corporate investment banker Strain: Not on file  Food Insecurity: Not on file  Transportation Needs: Not on file  Physical Activity: Not on file  Stress: Not on file  Social Connections: Not on file  Intimate Partner Violence: Not on file   Family History:  Family History  Problem Relation Age of Onset   Endometriosis Mother    Gallbladder disease Mother    Colon polyps Father    Pulmonary embolism Father    Hyperlipidemia Father    Pulmonary embolism Brother    Depression Brother    Pancreatic cancer Paternal Grandmother    Breast cancer Neg Hx    Colon cancer Neg Hx    Esophageal cancer Neg Hx    Rectal cancer Neg Hx    Stomach cancer Neg Hx     Medications Adah Stoneberg. [place]ourke Kathy had no medications administered during this visit. Current Outpatient Medications  Medication Sig Dispense Refill   cetirizine  (ZYRTEC) 10 MG tablet Take 10 mg by mouth as needed for allergies.     thiamine (VITAMIN B-1) 100 MG tablet Take 300 mg by mouth daily.     No current facility-administered medications for this visit.   Allergies Patient has no known allergies.  Physical Exam:  BP 133/80   Pulse 97   Ht 5' 9 (1.753 m)   Wt 188 lb (85.3 kg)   BMI 27.76 kg/m  Body mass index is 27.76 kg/m. General appearance: Well nourished, well developed female in no acute distress.  Neck:  Supple, normal appearance, and no thyromegaly  Cardiovascular: normal s1 and s2.  No murmurs, rubs or gallops. Respiratory:  Clear to auscultation bilateral. Normal respiratory effort Abdomen: positive bowel sounds and no masses, hernias; diffusely non tender to palpation, non distended Breasts: breasts appear normal, no suspicious masses, no skin or nipple changes or axillary nodes, and normal palpaton. Neuro/Psych:  Normal mood and affect.  Skin:  Warm and dry.  Lymphatic:  No inguinal lymphadenopathy.   Cervical exam performed in the presence of a chaperone Pelvic exam: is not limited by body habitus EGBUS: within normal limits, appears well estrogenized Vagina: within normal limits and with no blood or discharge in the vault, appears well estrogenized Cervix: normal appearing cervix without tenderness, discharge or lesions, appears well estrogenized Bimanual exam: negative  Laboratory: none  Radiology: none  Assessment: patient stable  Plan: 1. Well woman exam with routine gynecological exam (Primary) - FSH - Estradiol - TSH + free T4 - Hemoglobin A1c - Comprehensive metabolic panel with GFR - CBC - Vitamin B1 - VITAMIN D  25 Hydroxy (Vit-D Deficiency, Fractures) - Lipid panel  2. History of ovarian cyst I told her I'd recommend checking hormones to see if could be in menopause with caveats associated with checking d/w her. I also told her I'd recommend an u/s to see if has recurred; pt declines. I told her  to let us  know if she'd ever like one for us  to scheduld [facility] - Estradiol  3. RLQ abdominal pain See above - FSH - Estradiol - VITAMIN D  25 Hydroxy (Vit-D Deficiency, Fractures)  4. Weight gain Patient wondering if could be due to menopause and d/w her that can have metabolism changes associated with menopause/peri-menopause. Will check basic labs today and d/w her re: pros and cons of HRT. Pt to consider.  - Hemoglobin A1c - VITAMIN D  25 Hydroxy (Vit-D  Deficiency, Fractures)  5. Thiamine deficiency - Vitamin B1  Orders Placed This Encounter  Procedures   FSH   Estradiol   TSH + free T4   Hemoglobin A1c   Comprehensive metabolic panel with GFR   CBC   Vitamin B1   VITAMIN D  25 Hydroxy (Vit-D Deficiency, Fractures)   Lipid panel    RTC PRN  Future Appointments  Date Time Provider Department Center  07/05/2024 10:50 AM Gayle Saddie FALCON, PA-C [facility] None    Bebe Izell Raddle MD Attending [facility])

## 2024-06-03 ENCOUNTER — Encounter: Payer: Self-pay | Admitting: Obstetrics and Gynecology

## 2024-06-03 ENCOUNTER — Ambulatory Visit: Payer: Self-pay | Admitting: Obstetrics and Gynecology

## 2024-06-03 DIAGNOSIS — R7303 Prediabetes: Secondary | ICD-10-CM | POA: Insufficient documentation

## 2024-06-03 LAB — COMPREHENSIVE METABOLIC PANEL WITH GFR
ALT: 21 IU/L (ref 0–32)
AST: 16 IU/L (ref 0–40)
Albumin: 4.6 g/dL (ref 3.8–4.9)
Alkaline Phosphatase: 65 IU/L (ref 44–121)
BUN/Creatinine Ratio: 13 (ref 9–23)
BUN: 11 mg/dL (ref 6–24)
Bilirubin Total: 0.2 mg/dL (ref 0.0–1.2)
CO2: 23 mmol/L (ref 20–29)
Calcium: 9.6 mg/dL (ref 8.7–10.2)
Chloride: 98 mmol/L (ref 96–106)
Creatinine, Ser: 0.84 mg/dL (ref 0.57–1.00)
Globulin, Total: 2.5 g/dL (ref 1.5–4.5)
Glucose: 170 mg/dL — ABNORMAL HIGH (ref 70–99)
Potassium: 3.9 mmol/L (ref 3.5–5.2)
Sodium: 138 mmol/L (ref 134–144)
Total Protein: 7.1 g/dL (ref 6.0–8.5)
eGFR: 84 mL/min/{1.73_m2} (ref >59)

## 2024-06-03 LAB — LIPID PANEL
Chol/HDL Ratio: 2.2 ratio (ref 0.0–4.4)
Cholesterol, Total: 197 mg/dL (ref 100–199)
HDL: 91 mg/dL (ref >39)
LDL Chol Calc (NIH): 94 mg/dL (ref 0–99)
Triglycerides: 64 mg/dL (ref 0–149)
VLDL Cholesterol Cal: 12 mg/dL (ref 5–40)

## 2024-06-03 LAB — CBC
Hematocrit: 41.8 % (ref 34.0–46.6)
Hemoglobin: 13.8 g/dL (ref 11.1–15.9)
MCH: 32.9 pg (ref 26.6–33.0)
MCHC: 33 g/dL (ref 31.5–35.7)
MCV: 100 fL — ABNORMAL HIGH (ref 79–97)
Platelets: 236 10*3/uL (ref 150–450)
RBC: 4.2 x10E6/uL (ref 3.77–5.28)
RDW: 13.1 % (ref 11.7–15.4)
WBC: 8.8 10*3/uL (ref 3.4–10.8)

## 2024-06-03 LAB — VITAMIN B1: Thiamine: 116.9 nmol/L (ref 66.5–200.0)

## 2024-06-03 LAB — VITAMIN D 25 HYDROXY (VIT D DEFICIENCY, FRACTURES): Vit D, 25-Hydroxy: 39.9 ng/mL (ref 30.0–100.0)

## 2024-06-03 LAB — TSH+FREE T4
Free T4: 1.12 ng/dL (ref 0.82–1.77)
TSH: 1.21 u[IU]/mL (ref 0.450–4.500)

## 2024-06-03 LAB — FOLLICLE STIMULATING HORMONE: FSH: 10.1 m[IU]/mL

## 2024-06-03 LAB — ESTRADIOL: Estradiol: 90.6 pg/mL

## 2024-06-03 LAB — HEMOGLOBIN A1C
Est. average glucose Bld gHb Est-mCnc: 123 mg/dL
Hgb A1c MFr Bld: 5.9 % — ABNORMAL HIGH (ref 4.8–5.6)

## 2024-06-04 LAB — CYTOLOGY - PAP
Comment: NEGATIVE
Diagnosis: UNDETERMINED — AB
High risk HPV: NEGATIVE

## 2024-07-05 ENCOUNTER — Ambulatory Visit

## 2024-07-05 VITALS — BP 115/78 | HR 84 | Temp 97.6°F | Ht 69.0 in | Wt 183.0 lb

## 2024-07-05 DIAGNOSIS — J3089 Other allergic rhinitis: Secondary | ICD-10-CM

## 2024-07-05 DIAGNOSIS — R7303 Prediabetes: Secondary | ICD-10-CM

## 2024-07-05 DIAGNOSIS — G629 Polyneuropathy, unspecified: Secondary | ICD-10-CM

## 2024-07-05 DIAGNOSIS — Z0000 Encounter for general adult medical examination without abnormal findings: Secondary | ICD-10-CM | POA: Diagnosis not present

## 2024-07-05 NOTE — Progress Notes (Signed)
 Complete physical exam  Patient: Julie Garcia   DOB: 1971-10-09   52 y.o. Female  MRN: [id number]  Subjective:    Chief Complaint  Patient presents with   Annual Exam    Physical    Julie Garcia is a 53 y.o. female who presents today for a complete physical exam. She reports consuming a general diet. Exercises regularly as a Marketing executive. She generally feels well. She reports sleeping well. She does not have additional problems to discuss today.   History of Present Illness Julie Garcia is a 53 year old female with small fiber neuropathy who presents for a wellness appointment.  Peripheral neuropathy symptoms - Diagnosed with small fiber neuropathy by neurologist via biopsy - Numbness in feet extending up to mid-calf region - Difficulty distinguishing hot and cold sensations - Persistent burning sensation in feet - Wearing shoes is challenging - Prolonged standing exacerbates neuropathy symptoms - Treatments attempted include dry needling, ice packs, Epsom salt lotion, and nicotine patches - Nicotine patches started at 7 mg and increased to 14 mg, providing relief and reducing need for ibuprofen at night, without adverse effects  Metabolic and laboratory findings - Hemoglobin A1c is 5.9% - History of elevated cholesterol, but most recent lipid panel is normal - Previously had low thiamine levels, now normalized on recent blood work - Not currently taking thiamine supplements due to gastrointestinal intolerance  Allergic symptoms - Uses Zyrtec as needed, primarily in the spring  Mental health and psychosocial factors - In therapy following her father's passing in January - No depression, but monitoring mental health closely without medications      Most recent fall risk assessment:    07/05/2024   10:51 AM  Fall Risk   Falls in the past year? 0  Risk for fall due to : No Fall Risks  Follow up Falls evaluation completed     Most recent  depression screenings:    07/05/2024   10:51 AM 05/30/2024    1:15 PM  PHQ 2/9 Scores  PHQ - 2 Score 0 2  PHQ- 9 Score  5        Patient Care Team: Gayle Saddie JULIANNA DEVONNA as PCP - General (Physician Assistant)   Outpatient Medications Prior to Visit  Medication Sig   cetirizine (ZYRTEC) 10 MG tablet Take 10 mg by mouth as needed for allergies.   [DISCONTINUED] thiamine (VITAMIN B-1) 100 MG tablet Take 300 mg by mouth daily. (Patient not taking: Reported on 07/05/2024)   No facility-administered medications prior to visit.    ROS   Per HPI     Objective:     BP 115/78   Pulse 84   Temp 97.6 F (36.4 C) (Oral)   Ht 5' 9 (1.753 m)   Wt 183 lb 0.6 oz (83 kg)   SpO2 100%   BMI 27.03 kg/m    Physical Exam Constitutional:      General: She is not in acute distress.    Appearance: Normal appearance.  HENT:     Right Ear: Tympanic membrane normal.     Left Ear: Tympanic membrane normal.     Mouth/Throat:     Mouth: Mucous membranes are moist.     Pharynx: Oropharynx is clear.  Eyes:     Pupils: Pupils are equal, round, and reactive to light.  Cardiovascular:     Rate and Rhythm: Normal rate and regular rhythm.     Heart sounds: Normal heart sounds.  No murmur heard.    No friction rub. No gallop.  Pulmonary:     Effort: Pulmonary effort is normal. No respiratory distress.     Breath sounds: Normal breath sounds.  Abdominal:     General: Abdomen is flat. Bowel sounds are normal.     Palpations: Abdomen is soft.  Musculoskeletal:        General: No swelling.     Cervical back: Normal range of motion.  Lymphadenopathy:     Cervical: No cervical adenopathy.  Skin:    General: Skin is warm and dry.  Neurological:     General: No focal deficit present.     Mental Status: She is alert.  Psychiatric:        Mood and Affect: Mood normal.        Behavior: Behavior normal.        Thought Content: Thought content normal.     No results found for any visits on  07/05/24. Last CBC Lab Results  Component Value Date   WBC 8.8 05/30/2024   HGB 13.8 05/30/2024   HCT 41.8 05/30/2024   MCV 100 (H) 05/30/2024   MCH 32.9 05/30/2024   RDW 13.1 05/30/2024   PLT 236 05/30/2024   Last metabolic panel Lab Results  Component Value Date   GLUCOSE 170 (H) 05/30/2024   NA 138 05/30/2024   K 3.9 05/30/2024   CL 98 05/30/2024   CO2 23 05/30/2024   BUN 11 05/30/2024   CREATININE 0.84 05/30/2024   EGFR 84 05/30/2024   CALCIUM 9.6 05/30/2024   PHOS 3.4 05/10/2017   PROT 7.1 05/30/2024   ALBUMIN 4.6 05/30/2024   LABGLOB 2.5 05/30/2024   AGRATIO 1.5 02/09/2023   BILITOT 0.2 05/30/2024   ALKPHOS 65 05/30/2024   AST 16 05/30/2024   ALT 21 05/30/2024   Last lipids Lab Results  Component Value Date   CHOL 197 05/30/2024   HDL 91 05/30/2024   LDLCALC 94 05/30/2024   TRIG 64 05/30/2024   CHOLHDL 2.2 05/30/2024   Last hemoglobin A1c Lab Results  Component Value Date   HGBA1C 5.9 (H) 05/30/2024   Last thyroid functions Lab Results  Component Value Date   TSH 1.210 05/30/2024   Last vitamin D  Lab Results  Component Value Date   VD25OH 39.9 05/30/2024        Assessment & Plan:    Routine Health Maintenance and Physical Exam  Health Maintenance  Topic Date Due   Hepatitis C Screening  Never done   Hepatitis B Vaccine (1 of 3 - 19+ 3-dose series) Never done   Pneumococcal Vaccine for age over 30 (1 of 1 - PCV) Never done   Zoster (Shingles) Vaccine (1 of 2) Never done   COVID-19 Vaccine (4 - 2024-25 season) 08/06/2023   Flu Shot  07/05/2024   HIV Screening  05/18/2029*   Mammogram  03/21/2025   DTaP/Tdap/Td vaccine (2 - Td or Tdap) 07/05/2027   Colon Cancer Screening  03/23/2028   Pap with HPV screening  05/30/2029   HPV Vaccine  Aged Out   Meningitis B Vaccine  Aged Out  *Topic was postponed. The date shown is not the original due date.    Discussed health benefits of physical activity, and encouraged her to engage in regular  exercise appropriate for her age and condition.  Neuropathy Assessment & Plan: Diagnosed by neurologist with biopsy. Continue regular follow up with neurology. Improvement noted with nicotine patches. - Continue using nicotine patches  as needed. - Avoid gabapentin or pregabalin. - Monitor for symptom progression or spread.   Prediabetes Assessment & Plan: A1c at 5.9 indicates prediabetes. Active lifestyle with reduced carbohydrate intake. No medication required. - Monitor A1c levels annually. - Continue current diet and exercise regimen.   Environmental and seasonal allergies Assessment & Plan: Managed with over-the-counter Zyrtec as needed, primarily in the spring. - Continue Zyrtec as needed.    General medical exam Assessment & Plan: Routine wellness visit to establish care. A1c at 5.9 indicates prediabetes. Cholesterol levels normal. No menopausal symptoms. Colonoscopy due in 2029. Mammogram scheduled for July 19, 2024. No depression, attending bi-weekly therapy. Active lifestyle with reduced carbohydrate intake. - Schedule mammogram for July 19, 2024. - Schedule next colonoscopy for 2029. - Continue bi-weekly therapy sessions.    Assessment and Plan Assessment & Plan Adult Wellness Visit Routine wellness visit to establish care. A1c at 5.9 indicates prediabetes. Cholesterol levels normal. No menopausal symptoms. Colonoscopy due in 2029. Mammogram scheduled for July 19, 2024. No depression, attending bi-weekly therapy. Active lifestyle with reduced carbohydrate intake. - Schedule mammogram for July 19, 2024. - Schedule next colonoscopy for 2029. - Continue bi-weekly therapy sessions.  Small fiber neuropathy Diagnosed by neurologist with biopsy. Continue regular follow up with neurology. Improvement noted with nicotine patches. - Continue using nicotine patches as needed. - Avoid gabapentin or pregabalin. - Monitor for symptom progression or  spread.  Prediabetes A1c at 5.9 indicates prediabetes. Active lifestyle with reduced carbohydrate intake. No medication required. - Monitor A1c levels annually. - Continue current diet and exercise regimen.  Allergic rhinitis Managed with over-the-counter Zyrtec as needed, primarily in the spring. - Continue Zyrtec as needed.    Return in about 1 year (around 07/05/2025) for Physical.     Saddie JULIANNA Sacks, PA-C

## 2024-07-05 NOTE — Assessment & Plan Note (Signed)
 Managed with over-the-counter Zyrtec as needed, primarily in the spring. - Continue Zyrtec as needed.

## 2024-07-05 NOTE — Patient Instructions (Signed)
 VISIT SUMMARY: Today, you had a wellness appointment to review your overall health and address your small fiber neuropathy symptoms. Your A1c level indicates prediabetes, and your cholesterol levels are normal. We discussed your current treatments and lifestyle habits.  YOUR PLAN: ADULT WELLNESS VISIT: Routine wellness visit to establish care. Your A1c level is 5.9%, indicating prediabetes. Your cholesterol levels are normal. You have no menopausal symptoms and are actively managing your mental health. -Schedule your mammogram for July 19, 2024. -Schedule your next colonoscopy for 2029. -Continue attending bi-weekly therapy sessions.  SMALL FIBER NEUROPATHY: You have been diagnosed with small fiber neuropathy, which causes numbness, burning in your feet, and difficulty distinguishing hot and cold sensations. Nicotine patches have been helping with your symptoms. -Continue using nicotine patches as needed. -Avoid using gabapentin or pregabalin. -Monitor your symptoms for any progression or spread.  PREDIABETES: Your A1c level is 5.9%, which indicates prediabetes. You have an active lifestyle and are following a reduced carbohydrate diet. -Monitor your A1c levels annually. -Continue with your current diet and exercise regimen.  ALLERGIC RHINITIS: You experience allergic symptoms primarily in the spring and manage them with over-the-counter Zyrtec. -Continue taking Zyrtec as needed.  NICOTINE USE, TRANSDERMAL: You are using nicotine patches to help with your small fiber neuropathy symptoms. You started at 7 mg and increased to 14 mg without any adverse effects. -Continue using nicotine patches as needed. -Plan for a gradual tapering if you decide to discontinue use.  If you have any problems before your next visit feel free to message me via MyChart (minor issues or questions) or call the office, otherwise you may reach out to schedule an office visit.  Thank you! Saddie Sacks, PA-C

## 2024-07-05 NOTE — Assessment & Plan Note (Signed)
 Routine wellness visit to establish care. A1c at 5.9 indicates prediabetes. Cholesterol levels normal. No menopausal symptoms. Colonoscopy due in 2029. Mammogram scheduled for July 19, 2024. No depression, attending bi-weekly therapy. Active lifestyle with reduced carbohydrate intake. - Schedule mammogram for July 19, 2024. - Schedule next colonoscopy for 2029. - Continue bi-weekly therapy sessions.

## 2024-07-05 NOTE — Assessment & Plan Note (Signed)
 A1c at 5.9 indicates prediabetes. Active lifestyle with reduced carbohydrate intake. No medication required. - Monitor A1c levels annually. - Continue current diet and exercise regimen.

## 2024-07-05 NOTE — Assessment & Plan Note (Signed)
 Diagnosed by neurologist with biopsy. Continue regular follow up with neurology. Improvement noted with nicotine patches. - Continue using nicotine patches as needed. - Avoid gabapentin or pregabalin. - Monitor for symptom progression or spread.

## 2024-07-08 ENCOUNTER — Other Ambulatory Visit: Payer: Self-pay

## 2024-07-08 DIAGNOSIS — Z1231 Encounter for screening mammogram for malignant neoplasm of breast: Secondary | ICD-10-CM

## 2024-07-19 ENCOUNTER — Encounter

## 2024-07-19 DIAGNOSIS — Z1231 Encounter for screening mammogram for malignant neoplasm of breast: Secondary | ICD-10-CM

## 2024-08-02 ENCOUNTER — Encounter

## 2024-08-14 ENCOUNTER — Ambulatory Visit
Admission: RE | Admit: 2024-08-14 | Discharge: 2024-08-14 | Disposition: A | Discharge status: Home / Self Care | Source: Ambulatory Visit

## 2024-08-14 DIAGNOSIS — Z1231 Encounter for screening mammogram for malignant neoplasm of breast: Secondary | ICD-10-CM

## 2024-08-19 ENCOUNTER — Ambulatory Visit: Payer: Self-pay

## 2024-08-19 DIAGNOSIS — R928 Other abnormal and inconclusive findings on diagnostic imaging of breast: Secondary | ICD-10-CM

## 2024-08-19 DIAGNOSIS — Z1231 Encounter for screening mammogram for malignant neoplasm of breast: Secondary | ICD-10-CM

## 2024-08-20 ENCOUNTER — Other Ambulatory Visit: Payer: Self-pay | Admitting: Medical Genetics

## 2024-08-20 ENCOUNTER — Other Ambulatory Visit: Payer: Self-pay

## 2024-08-20 DIAGNOSIS — Z1231 Encounter for screening mammogram for malignant neoplasm of breast: Secondary | ICD-10-CM

## 2024-08-20 DIAGNOSIS — R928 Other abnormal and inconclusive findings on diagnostic imaging of breast: Secondary | ICD-10-CM

## 2024-08-26 ENCOUNTER — Other Ambulatory Visit
Admission: RE | Admit: 2024-08-26 | Discharge: 2024-08-26 | Disposition: A | Discharge status: Home / Self Care | Source: Ambulatory Visit | Attending: Medical Genetics | Admitting: Medical Genetics

## 2024-08-27 ENCOUNTER — Ambulatory Visit
Admission: RE | Admit: 2024-08-27 | Discharge: 2024-08-27 | Disposition: A | Discharge status: Home / Self Care | Source: Ambulatory Visit

## 2024-08-27 DIAGNOSIS — R928 Other abnormal and inconclusive findings on diagnostic imaging of breast: Secondary | ICD-10-CM

## 2024-09-03 ENCOUNTER — Other Ambulatory Visit: Payer: Self-pay

## 2024-09-03 DIAGNOSIS — N6320 Unspecified lump in the left breast, unspecified quadrant: Secondary | ICD-10-CM

## 2024-09-03 LAB — GENECONNECT MOLECULAR SCREEN: Genetic Analysis Overall Interpretation: NEGATIVE

## 2025-02-28 ENCOUNTER — Other Ambulatory Visit

## 2025-07-04 ENCOUNTER — Other Ambulatory Visit

## 2025-07-11 ENCOUNTER — Encounter
